# Patient Record
Sex: Female | Born: 1965 | Race: White | Hispanic: No | Marital: Married | Smoking: Never smoker
Health system: Southern US, Community
[De-identification: ages and names within clinical notes are randomized; demographics above are authoritative.]

## PROBLEM LIST (undated history)

## (undated) DIAGNOSIS — L409 Psoriasis, unspecified: Secondary | ICD-10-CM

## (undated) DIAGNOSIS — R519 Headache, unspecified: Secondary | ICD-10-CM

## (undated) DIAGNOSIS — N83202 Unspecified ovarian cyst, left side: Secondary | ICD-10-CM

## (undated) HISTORY — PX: CHOLECYSTECTOMY: SHX55

## (undated) HISTORY — PX: OTHER SURGICAL HISTORY: SHX169

---

## 2005-04-24 ENCOUNTER — Ambulatory Visit: Payer: Self-pay | Admitting: Obstetrics and Gynecology

## 2007-09-08 ENCOUNTER — Ambulatory Visit: Payer: Self-pay | Admitting: Obstetrics and Gynecology

## 2008-09-15 ENCOUNTER — Ambulatory Visit: Payer: Self-pay | Admitting: Obstetrics and Gynecology

## 2009-11-30 ENCOUNTER — Ambulatory Visit: Payer: Self-pay | Admitting: Internal Medicine

## 2010-12-13 ENCOUNTER — Ambulatory Visit: Payer: Self-pay | Admitting: Obstetrics and Gynecology

## 2010-12-15 ENCOUNTER — Ambulatory Visit: Payer: Self-pay | Admitting: Obstetrics and Gynecology

## 2011-07-25 ENCOUNTER — Ambulatory Visit: Payer: Self-pay | Admitting: Obstetrics and Gynecology

## 2012-04-01 ENCOUNTER — Ambulatory Visit: Payer: Self-pay | Admitting: Obstetrics and Gynecology

## 2012-11-18 ENCOUNTER — Ambulatory Visit: Payer: Self-pay | Admitting: Obstetrics and Gynecology

## 2015-12-14 ENCOUNTER — Other Ambulatory Visit: Payer: Self-pay | Admitting: Obstetrics and Gynecology

## 2015-12-14 DIAGNOSIS — Z1231 Encounter for screening mammogram for malignant neoplasm of breast: Secondary | ICD-10-CM

## 2015-12-22 ENCOUNTER — Ambulatory Visit
Admission: RE | Admit: 2015-12-22 | Discharge: 2015-12-22 | Disposition: A | Discharge status: Home / Self Care | Payer: BC Managed Care – PPO | Source: Ambulatory Visit | Attending: Obstetrics and Gynecology | Admitting: Obstetrics and Gynecology

## 2015-12-22 DIAGNOSIS — Z1231 Encounter for screening mammogram for malignant neoplasm of breast: Secondary | ICD-10-CM | POA: Insufficient documentation

## 2017-01-23 ENCOUNTER — Other Ambulatory Visit (HOSPITAL_COMMUNITY): Payer: Self-pay | Admitting: Obstetrics and Gynecology

## 2017-01-23 DIAGNOSIS — Z1231 Encounter for screening mammogram for malignant neoplasm of breast: Secondary | ICD-10-CM

## 2017-02-27 ENCOUNTER — Ambulatory Visit
Admission: RE | Admit: 2017-02-27 | Discharge: 2017-02-27 | Disposition: A | Discharge status: Home / Self Care | Payer: BC Managed Care – PPO | Source: Ambulatory Visit | Attending: Obstetrics and Gynecology | Admitting: Obstetrics and Gynecology

## 2017-02-27 DIAGNOSIS — Z1231 Encounter for screening mammogram for malignant neoplasm of breast: Secondary | ICD-10-CM | POA: Diagnosis not present

## 2018-01-24 ENCOUNTER — Other Ambulatory Visit: Payer: Self-pay | Admitting: Obstetrics and Gynecology

## 2018-01-24 DIAGNOSIS — Z1231 Encounter for screening mammogram for malignant neoplasm of breast: Secondary | ICD-10-CM

## 2018-02-28 ENCOUNTER — Ambulatory Visit
Admission: RE | Admit: 2018-02-28 | Discharge: 2018-02-28 | Disposition: A | Discharge status: Home / Self Care | Payer: BC Managed Care – PPO | Source: Ambulatory Visit | Attending: Obstetrics and Gynecology | Admitting: Obstetrics and Gynecology

## 2018-02-28 DIAGNOSIS — Z1231 Encounter for screening mammogram for malignant neoplasm of breast: Secondary | ICD-10-CM | POA: Diagnosis present

## 2018-03-04 ENCOUNTER — Other Ambulatory Visit: Payer: Self-pay | Admitting: Obstetrics and Gynecology

## 2018-03-04 DIAGNOSIS — R921 Mammographic calcification found on diagnostic imaging of breast: Secondary | ICD-10-CM

## 2018-03-04 DIAGNOSIS — R928 Other abnormal and inconclusive findings on diagnostic imaging of breast: Secondary | ICD-10-CM

## 2018-03-12 ENCOUNTER — Ambulatory Visit
Admission: RE | Admit: 2018-03-12 | Discharge: 2018-03-12 | Disposition: A | Discharge status: Home / Self Care | Payer: BC Managed Care – PPO | Source: Ambulatory Visit | Attending: Obstetrics and Gynecology | Admitting: Obstetrics and Gynecology

## 2018-03-12 DIAGNOSIS — R921 Mammographic calcification found on diagnostic imaging of breast: Secondary | ICD-10-CM | POA: Diagnosis present

## 2018-03-12 DIAGNOSIS — R928 Other abnormal and inconclusive findings on diagnostic imaging of breast: Secondary | ICD-10-CM

## 2019-02-02 ENCOUNTER — Other Ambulatory Visit: Payer: Self-pay | Admitting: Obstetrics and Gynecology

## 2019-02-02 DIAGNOSIS — Z1231 Encounter for screening mammogram for malignant neoplasm of breast: Secondary | ICD-10-CM

## 2019-03-11 ENCOUNTER — Other Ambulatory Visit: Payer: Self-pay

## 2019-03-11 ENCOUNTER — Ambulatory Visit
Admission: RE | Admit: 2019-03-11 | Discharge: 2019-03-11 | Disposition: A | Discharge status: Home / Self Care | Payer: BC Managed Care – PPO | Source: Ambulatory Visit | Attending: Obstetrics and Gynecology | Admitting: Obstetrics and Gynecology

## 2019-03-11 DIAGNOSIS — Z1231 Encounter for screening mammogram for malignant neoplasm of breast: Secondary | ICD-10-CM | POA: Insufficient documentation

## 2020-02-19 ENCOUNTER — Other Ambulatory Visit: Payer: Self-pay | Admitting: Obstetrics and Gynecology

## 2020-02-19 DIAGNOSIS — Z1231 Encounter for screening mammogram for malignant neoplasm of breast: Secondary | ICD-10-CM

## 2020-03-16 ENCOUNTER — Ambulatory Visit
Admission: RE | Admit: 2020-03-16 | Discharge: 2020-03-16 | Disposition: A | Discharge status: Home / Self Care | Payer: BC Managed Care – PPO | Source: Ambulatory Visit | Attending: Obstetrics and Gynecology | Admitting: Obstetrics and Gynecology

## 2020-03-16 DIAGNOSIS — Z1231 Encounter for screening mammogram for malignant neoplasm of breast: Secondary | ICD-10-CM | POA: Diagnosis present

## 2021-02-06 ENCOUNTER — Other Ambulatory Visit: Payer: Self-pay | Admitting: Obstetrics and Gynecology

## 2021-02-06 DIAGNOSIS — Z1231 Encounter for screening mammogram for malignant neoplasm of breast: Secondary | ICD-10-CM

## 2021-03-17 ENCOUNTER — Other Ambulatory Visit: Payer: Self-pay

## 2021-03-17 ENCOUNTER — Ambulatory Visit
Admission: RE | Admit: 2021-03-17 | Discharge: 2021-03-17 | Disposition: A | Discharge status: Home / Self Care | Payer: BC Managed Care – PPO | Source: Ambulatory Visit | Attending: Obstetrics and Gynecology | Admitting: Obstetrics and Gynecology

## 2021-03-17 DIAGNOSIS — Z1231 Encounter for screening mammogram for malignant neoplasm of breast: Secondary | ICD-10-CM | POA: Diagnosis present

## 2021-03-22 ENCOUNTER — Other Ambulatory Visit: Payer: Self-pay | Admitting: Obstetrics and Gynecology

## 2021-03-22 DIAGNOSIS — R928 Other abnormal and inconclusive findings on diagnostic imaging of breast: Secondary | ICD-10-CM

## 2021-03-22 DIAGNOSIS — N6320 Unspecified lump in the left breast, unspecified quadrant: Secondary | ICD-10-CM

## 2021-03-23 ENCOUNTER — Ambulatory Visit
Admission: RE | Admit: 2021-03-23 | Discharge: 2021-03-23 | Disposition: A | Discharge status: Home / Self Care | Payer: BC Managed Care – PPO | Source: Ambulatory Visit | Attending: Obstetrics and Gynecology | Admitting: Obstetrics and Gynecology

## 2021-03-23 ENCOUNTER — Other Ambulatory Visit: Payer: Self-pay

## 2021-03-23 DIAGNOSIS — N6320 Unspecified lump in the left breast, unspecified quadrant: Secondary | ICD-10-CM | POA: Insufficient documentation

## 2021-03-23 DIAGNOSIS — R928 Other abnormal and inconclusive findings on diagnostic imaging of breast: Secondary | ICD-10-CM | POA: Diagnosis present

## 2021-03-30 ENCOUNTER — Encounter: Payer: Self-pay | Admitting: *Deleted

## 2021-03-31 ENCOUNTER — Encounter: Payer: Self-pay | Admitting: *Deleted

## 2021-03-31 ENCOUNTER — Ambulatory Visit: Payer: BC Managed Care – PPO | Admitting: Certified Registered Nurse Anesthetist

## 2021-03-31 ENCOUNTER — Ambulatory Visit
Admission: RE | Admit: 2021-03-31 | Discharge: 2021-03-31 | Disposition: A | Discharge status: Home / Self Care | Payer: BC Managed Care – PPO | Attending: Gastroenterology | Admitting: Gastroenterology

## 2021-03-31 ENCOUNTER — Encounter
Admission: RE | Disposition: A | Discharge status: Home / Self Care | Payer: Self-pay | Source: Home / Self Care | Attending: Gastroenterology

## 2021-03-31 DIAGNOSIS — Z6837 Body mass index (BMI) 37.0-37.9, adult: Secondary | ICD-10-CM | POA: Insufficient documentation

## 2021-03-31 DIAGNOSIS — K640 First degree hemorrhoids: Secondary | ICD-10-CM | POA: Insufficient documentation

## 2021-03-31 DIAGNOSIS — Z885 Allergy status to narcotic agent status: Secondary | ICD-10-CM | POA: Insufficient documentation

## 2021-03-31 DIAGNOSIS — Z9049 Acquired absence of other specified parts of digestive tract: Secondary | ICD-10-CM | POA: Diagnosis not present

## 2021-03-31 DIAGNOSIS — D123 Benign neoplasm of transverse colon: Secondary | ICD-10-CM | POA: Diagnosis not present

## 2021-03-31 DIAGNOSIS — Z793 Long term (current) use of hormonal contraceptives: Secondary | ICD-10-CM | POA: Insufficient documentation

## 2021-03-31 DIAGNOSIS — E669 Obesity, unspecified: Secondary | ICD-10-CM | POA: Insufficient documentation

## 2021-03-31 DIAGNOSIS — K5730 Diverticulosis of large intestine without perforation or abscess without bleeding: Secondary | ICD-10-CM | POA: Insufficient documentation

## 2021-03-31 DIAGNOSIS — Z880 Allergy status to penicillin: Secondary | ICD-10-CM | POA: Diagnosis not present

## 2021-03-31 DIAGNOSIS — Z1211 Encounter for screening for malignant neoplasm of colon: Secondary | ICD-10-CM | POA: Diagnosis not present

## 2021-03-31 DIAGNOSIS — Z79899 Other long term (current) drug therapy: Secondary | ICD-10-CM | POA: Diagnosis not present

## 2021-03-31 DIAGNOSIS — D120 Benign neoplasm of cecum: Secondary | ICD-10-CM | POA: Insufficient documentation

## 2021-03-31 HISTORY — DX: Unspecified ovarian cyst, left side: N83.202

## 2021-03-31 HISTORY — DX: Psoriasis, unspecified: L40.9

## 2021-03-31 HISTORY — PX: COLONOSCOPY WITH PROPOFOL: SHX5780

## 2021-03-31 HISTORY — DX: Headache, unspecified: R51.9

## 2021-03-31 LAB — POCT PREGNANCY, URINE: Preg Test, Ur: NEGATIVE

## 2021-03-31 SURGERY — COLONOSCOPY WITH PROPOFOL
Anesthesia: Monitor Anesthesia Care

## 2021-03-31 MED ORDER — MIDAZOLAM HCL 2 MG/2ML IJ SOLN
INTRAMUSCULAR | Status: DC | PRN
  Administered 2021-03-31: 2 mg via INTRAVENOUS

## 2021-03-31 MED ORDER — PROPOFOL 500 MG/50ML IV EMUL
INTRAVENOUS | Status: AC
  Filled 2021-03-31: qty 50

## 2021-03-31 MED ORDER — PROPOFOL 500 MG/50ML IV EMUL
INTRAVENOUS | Status: DC | PRN
  Administered 2021-03-31: 125 ug/kg/min via INTRAVENOUS

## 2021-03-31 MED ORDER — MIDAZOLAM HCL 2 MG/2ML IJ SOLN
INTRAMUSCULAR | Status: AC
  Filled 2021-03-31: qty 2

## 2021-03-31 MED ORDER — PROPOFOL 10 MG/ML IV BOLUS
INTRAVENOUS | Status: DC | PRN
  Administered 2021-03-31: 30 mg via INTRAVENOUS
  Administered 2021-03-31: 70 mg via INTRAVENOUS

## 2021-03-31 MED ORDER — LIDOCAINE HCL (CARDIAC) PF 100 MG/5ML IV SOSY
PREFILLED_SYRINGE | INTRAVENOUS | Status: DC | PRN
  Administered 2021-03-31: 50 mg via INTRAVENOUS

## 2021-03-31 MED ORDER — SODIUM CHLORIDE 0.9 % IV SOLN
INTRAVENOUS | Status: DC
  Administered 2021-03-31: 1000 mL via INTRAVENOUS

## 2021-03-31 NOTE — Anesthesia Preprocedure Evaluation (Signed)
Anesthesia Evaluation  Patient identified by MRN, date of birth, ID band Patient awake    Reviewed: Allergy & Precautions, NPO status , Patient's Chart, lab work & pertinent test results  Airway Mallampati: II  TM Distance: >3 FB Neck ROM: Full    Dental no notable dental hx.    Pulmonary neg pulmonary ROS,    Pulmonary exam normal breath sounds clear to auscultation- rhonchi (-) wheezing      Cardiovascular Exercise Tolerance: Good negative cardio ROS Normal cardiovascular exam Rhythm:Regular Rate:Normal - Systolic murmurs and - Diastolic murmurs    Neuro/Psych  Headaches,    GI/Hepatic negative GI ROS, Neg liver ROS,   Endo/Other  negative endocrine ROS  Renal/GU negative Renal ROS  negative genitourinary   Musculoskeletal   Abdominal   Peds negative pediatric ROS (+)  Hematology negative hematology ROS (+)   Anesthesia Other Findings   Reproductive/Obstetrics                             Anesthesia Physical Anesthesia Plan  ASA: 2  Anesthesia Plan: MAC   Post-op Pain Management:    Induction: Intravenous  PONV Risk Score and Plan: 1 and Propofol infusion and TIVA  Airway Management Planned: Mask and Natural Airway  Additional Equipment:   Intra-op Plan:   Post-operative Plan:   Informed Consent: I have reviewed the patients History and Physical, chart, labs and discussed the procedure including the risks, benefits and alternatives for the proposed anesthesia with the patient or authorized representative who has indicated his/her understanding and acceptance.     Dental advisory given  Plan Discussed with: Anesthesiologist and CRNA  Anesthesia Plan Comments: (IVGA, PIV x 1, ASA SM, RBO discussed, Consent signed.)        Anesthesia Quick Evaluation

## 2021-03-31 NOTE — Op Note (Signed)
[facility] Patient Name: Brandy Rush Procedure Date: 03/31/2021 12:57 PM MRN: [id number] Account #: [id number] Date of Birth: 12-Sep-1965 Admit Type: Outpatient Age: 55 Room: [facility] ENDO ROOM 3 Gender: Female Note Status: Finalized Procedure:             Colonoscopy Indications:           Screening for colorectal malignant neoplasm Providers:             Andrey Farmer MD, MD Referring MD:          Rusty Aus, MD (Referring MD) Medicines:             Monitored Anesthesia Care Complications:         No immediate complications. Estimated blood loss:                         Minimal. Procedure:             Pre-Anesthesia Assessment:                        - Prior to the procedure, a History and Physical was                         performed, and patient medications and allergies were                         reviewed. The patient is competent. The risks and                         benefits of the procedure and the sedation options and                         risks were discussed with the patient. All questions                         were answered and informed consent was obtained.                         Patient identification and proposed procedure were                         verified by the physician, the nurse, the anesthetist                         and the technician in the endoscopy suite. Mental                         Status Examination: alert and oriented. Airway                         Examination: normal oropharyngeal airway and neck                         mobility. Respiratory Examination: clear to                         auscultation. CV Examination: normal. Prophylactic  Antibiotics: The patient does not require prophylactic                         antibiotics. Prior Anticoagulants: The patient has                         taken no previous anticoagulant or antiplatelet                         agents. ASA  Grade Assessment: II - A patient with mild                         systemic disease. After reviewing the risks and                         benefits, the patient was deemed in satisfactory                         condition to undergo the procedure. The anesthesia                         plan was to use monitored anesthesia care (MAC).                         Immediately prior to administration of medications,                         the patient was re-assessed for adequacy to receive                         sedatives. The heart rate, respiratory rate, oxygen                         saturations, blood pressure, adequacy of pulmonary                         ventilation, and response to care were monitored                         throughout the procedure. The physical status of the                         patient was re-assessed after the procedure.                        After obtaining informed consent, the colonoscope was                         passed under direct vision. Throughout the procedure,                         the patient's blood pressure, pulse, and oxygen                         saturations were monitored continuously. The                         Colonoscope was introduced through the anus and  advanced to the the cecum, identified by appendiceal                         orifice and ileocecal valve. The colonoscopy was                         performed without difficulty. The patient tolerated                         the procedure well. The quality of the bowel                         preparation was good. Findings:      The perianal and digital rectal examinations were normal.      A 4 mm polyp was found in the cecum. The polyp was sessile. The polyp       was removed with a cold snare. Resection and retrieval were complete.       Estimated blood loss was minimal.      Two sessile polyps were found in the hepatic flexure. The polyps were 2       to 3 mm  in size. These polyps were removed with a cold snare. Resection       and retrieval were complete. Estimated blood loss was minimal.      A 1 mm polyp was found in the distal transverse colon. The polyp was       sessile. The polyp was removed with a jumbo cold forceps. Resection and       retrieval were complete. Estimated blood loss was minimal.      A few small-mouthed diverticula were found in the sigmoid colon.      Internal hemorrhoids were found during retroflexion. The hemorrhoids       were Grade I (internal hemorrhoids that do not prolapse).      The exam was otherwise without abnormality on direct and retroflexion       views. Impression:            - One 4 mm polyp in the cecum, removed with a cold                         snare. Resected and retrieved.                        - Two 2 to 3 mm polyps at the hepatic flexure, removed                         with a cold snare. Resected and retrieved.                        - One 1 mm polyp in the distal transverse colon,                         removed with a jumbo cold forceps. Resected and                         retrieved.                        - Diverticulosis in the sigmoid colon.                        -  Internal hemorrhoids.                        - The examination was otherwise normal on direct and                         retroflexion views. Recommendation:        - Discharge patient to home.                        - Resume previous diet.                        - Continue present medications.                        - Await pathology results.                        - Repeat colonoscopy in 3 years for surveillance.                        - Return to referring physician as previously                         scheduled. Procedure Code(s):     --- Professional ---                        [phone], Colonoscopy, flexible; with removal of                         tumor(s), polyp(s), or other lesion(s) by snare                          technique                        45380, 65, Colonoscopy, flexible; with biopsy, single                         or multiple Diagnosis Code(s):     --- Professional ---                        K63.5, Polyp of colon                        Z12.11, Encounter for screening for malignant neoplasm                         of colon                        K64.0, First degree hemorrhoids                        K57.30, Diverticulosis of large intestine without                         perforation or abscess without bleeding CPT copyright 2019 [employer] Association. All rights reserved. The codes documented in this report are preliminary and upon coder review may  be revised to meet current compliance requirements. Andrey Farmer  MD, MD 03/31/2021 1:26:54 PM Number of Addenda: 0 Note Initiated On: 03/31/2021 12:57 PM Scope Withdrawal Time: 0 hours 15 minutes 10 seconds  Total Procedure Duration: 0 hours 20 minutes 4 seconds  Estimated Blood Loss:  Estimated blood loss: none.      [facility]

## 2021-03-31 NOTE — Interval H&P Note (Signed)
History and Physical Interval Note:  03/31/2021 12:46 PM  Brandy Rush  has presented today for surgery, with the diagnosis of Screening.  The various methods of treatment have been discussed with the patient and family. After consideration of risks, benefits and other options for treatment, the patient has consented to  Procedure(s): COLONOSCOPY WITH PROPOFOL (N/A) as a surgical intervention.  The patient's history has been reviewed, patient examined, no change in status, stable for surgery.  I have reviewed the patient's chart and labs.  Questions were answered to the patient's satisfaction.     Lesly Rubenstein  Ok to proceed with colonoscopy

## 2021-03-31 NOTE — Transfer of Care (Signed)
Immediate Anesthesia Transfer of Care Note  Patient: Brandy Rush  Procedure(s) Performed: COLONOSCOPY WITH PROPOFOL  Patient Location: PACU  Anesthesia Type:General  Level of Consciousness: awake  Airway & Oxygen Therapy: Patient Spontanous Breathing and Patient connected to face mask oxygen  Post-op Assessment: Report given to RN and Post -op Vital signs reviewed and stable  Post vital signs: Reviewed and stable  Last Vitals:  Vitals Value Taken Time  BP 117/73 03/31/21 1327  Temp    Pulse 88 03/31/21 1328  Resp 21 03/31/21 1328  SpO2 99 % 03/31/21 1328  Vitals shown include unvalidated device data.  Last Pain:  Vitals:   03/31/21 1327  TempSrc:   PainSc: 0-No pain      Patients Stated Pain Goal: 0 ([phone] [id number])  Complications: No notable events documented.

## 2021-03-31 NOTE — Anesthesia Postprocedure Evaluation (Signed)
Anesthesia Post Note  Patient: Brandy Rush  Procedure(s) Performed: COLONOSCOPY WITH PROPOFOL  Patient location during evaluation: PACU Anesthesia Type: MAC Level of consciousness: awake and alert Pain management: pain level controlled Vital Signs Assessment: post-procedure vital signs reviewed and stable Respiratory status: spontaneous breathing Cardiovascular status: blood pressure returned to baseline Postop Assessment: no headache Anesthetic complications: no   No notable events documented.   Last Vitals:  Vitals:   03/31/21 1337 03/31/21 1347  BP: 135/86 130/78  Pulse: 97 86  Resp: 18 20  Temp:    SpO2: 100% 99%    Last Pain:  Vitals:   03/31/21 1347  TempSrc:   PainSc: 0-No pain                 Milinda Pointer

## 2021-03-31 NOTE — H&P (Signed)
Outpatient short stay form Pre-procedure 03/31/2021 12:43 PM Brandy Miyamoto MD, MPH  Primary Physician: Dr. Sabra Heck  Reason for visit:  Screening Colonoscopy  History of present illness:   55 y/o lady with history of obesity and depression here for screening colonoscopy. History of cholecystectomy and two c-sections. No blood thinners. No family history of GI malignancies.    Current Facility-Administered Medications:    0.9 %  sodium chloride infusion, , Intravenous, Continuous, Natori Gudino, Hilton Cork, MD  Medications Prior to Admission  Medication Sig Dispense Refill Last Dose   etodolac (LODINE) 400 MG tablet Take 400 mg by mouth 2 (two) times daily.      levonorgestrel (LILETTA, 52 MG,) 20.1 MCG/DAY IUD 1 each by Intrauterine route once.      sertraline (ZOLOFT) 50 MG tablet Take 50 mg by mouth daily.   03/31/2021 at 0500am     Allergies  Allergen Reactions   Codeine Sulfate [Codeine]    Penicillins      Past Medical History:  Diagnosis Date   Cyst of left ovary    Headache    Psoriasis     Review of systems:  Otherwise negative.    Physical Exam  Gen: Alert, oriented. Appears stated age.  HEENT: PERRLA. Lungs: No respiratory distress CV: RRR Abd: soft, benign, no masses Ext: No edema    Planned procedures: Proceed with colonoscopy. The patient understands the nature of the planned procedure, indications, risks, alternatives and potential complications including but not limited to bleeding, infection, perforation, damage to internal organs and possible oversedation/side effects from anesthesia. The patient agrees and gives consent to proceed.  Please refer to procedure notes for findings, recommendations and patient disposition/instructions.     Brandy Miyamoto MD, MPH Gastroenterology 03/31/2021  12:43 PM

## 2021-04-03 LAB — SURGICAL PATHOLOGY

## 2021-09-08 ENCOUNTER — Other Ambulatory Visit: Payer: Self-pay

## 2021-09-08 ENCOUNTER — Emergency Department
Admission: EM | Admit: 2021-09-08 | Discharge: 2021-09-08 | Disposition: A | Discharge status: Home / Self Care | Payer: BC Managed Care – PPO | Attending: Emergency Medicine | Admitting: Emergency Medicine

## 2021-09-08 ENCOUNTER — Encounter: Payer: Self-pay | Admitting: Emergency Medicine

## 2021-09-08 ENCOUNTER — Emergency Department: Payer: BC Managed Care – PPO

## 2021-09-08 DIAGNOSIS — J449 Chronic obstructive pulmonary disease, unspecified: Secondary | ICD-10-CM | POA: Diagnosis not present

## 2021-09-08 DIAGNOSIS — Z20822 Contact with and (suspected) exposure to covid-19: Secondary | ICD-10-CM | POA: Diagnosis not present

## 2021-09-08 DIAGNOSIS — I480 Paroxysmal atrial fibrillation: Secondary | ICD-10-CM | POA: Diagnosis not present

## 2021-09-08 DIAGNOSIS — R000 Tachycardia, unspecified: Secondary | ICD-10-CM | POA: Diagnosis present

## 2021-09-08 DIAGNOSIS — R0602 Shortness of breath: Secondary | ICD-10-CM | POA: Diagnosis not present

## 2021-09-08 LAB — COMPREHENSIVE METABOLIC PANEL
ALT: 27 U/L (ref 0–44)
AST: 26 U/L (ref 15–41)
Albumin: 4.1 g/dL (ref 3.5–5.0)
Alkaline Phosphatase: 93 U/L (ref 38–126)
Anion gap: 7 (ref 5–15)
BUN: 13 mg/dL (ref 6–20)
CO2: 23 mmol/L (ref 22–32)
Calcium: 8.6 mg/dL — ABNORMAL LOW (ref 8.9–10.3)
Chloride: 106 mmol/L (ref 98–111)
Creatinine, Ser: 0.66 mg/dL (ref 0.44–1.00)
GFR, Estimated: >60 mL/min (ref >60)
Glucose, Bld: 115 mg/dL — ABNORMAL HIGH (ref 70–99)
Potassium: 3.5 mmol/L (ref 3.5–5.1)
Sodium: 136 mmol/L (ref 135–145)
Total Bilirubin: 0.9 mg/dL (ref 0.3–1.2)
Total Protein: 7 g/dL (ref 6.5–8.1)

## 2021-09-08 LAB — CBC WITH DIFFERENTIAL/PLATELET
Abs Immature Granulocytes: 0.06 10*3/uL (ref 0.00–0.07)
Basophils Absolute: 0 10*3/uL (ref 0.0–0.1)
Basophils Relative: 0 %
Eosinophils Absolute: 0.1 10*3/uL (ref 0.0–0.5)
Eosinophils Relative: 1 %
HCT: 38.7 % (ref 36.0–46.0)
Hemoglobin: 13 g/dL (ref 12.0–15.0)
Immature Granulocytes: 1 %
Lymphocytes Relative: 23 %
Lymphs Abs: 2.3 10*3/uL (ref 0.7–4.0)
MCH: 29.3 pg (ref 26.0–34.0)
MCHC: 33.6 g/dL (ref 30.0–36.0)
MCV: 87.4 fL (ref 80.0–100.0)
Monocytes Absolute: 0.6 10*3/uL (ref 0.1–1.0)
Monocytes Relative: 6 %
Neutro Abs: 6.9 10*3/uL (ref 1.7–7.7)
Neutrophils Relative %: 69 %
Platelets: 241 10*3/uL (ref 150–400)
RBC: 4.43 MIL/uL (ref 3.87–5.11)
RDW: 13.2 % (ref 11.5–15.5)
WBC: 10.1 10*3/uL (ref 4.0–10.5)
nRBC: 0 % (ref 0.0–0.2)

## 2021-09-08 LAB — RESP PANEL BY RT-PCR (FLU A&B, COVID) ARPGX2
Influenza A by PCR: NEGATIVE
Influenza B by PCR: NEGATIVE
SARS Coronavirus 2 by RT PCR: NEGATIVE

## 2021-09-08 LAB — MAGNESIUM: Magnesium: 2 mg/dL (ref 1.7–2.4)

## 2021-09-08 LAB — BRAIN NATRIURETIC PEPTIDE: B Natriuretic Peptide: 18.8 pg/mL (ref 0.0–100.0)

## 2021-09-08 LAB — TROPONIN I (HIGH SENSITIVITY)
Troponin I (High Sensitivity): 24 ng/L — ABNORMAL HIGH (ref <18)
Troponin I (High Sensitivity): 29 ng/L — ABNORMAL HIGH (ref <18)

## 2021-09-08 LAB — D-DIMER, QUANTITATIVE: D-Dimer, Quant: 1.11 ug/mL-FEU — ABNORMAL HIGH (ref 0.00–0.50)

## 2021-09-08 IMAGING — CT CT ANGIO CHEST
2 of 7 series · 19 of 46 positions shown · IV contrast (APPLIED)
Comparison: None.

CLINICAL DATA: Palpitations and shortness of breath. Elevated
D-dimer.

EXAM:
CT ANGIOGRAPHY CHEST WITH CONTRAST
TECHNIQUE: Multidetector CT imaging of the chest was performed using the
standard protocol during bolus administration of intravenous
contrast. Multiplanar CT image reconstructions and MIPs were
obtained to evaluate the vascular anatomy.
CONTRAST:  75mL OMNIPAQUE IOHEXOL 350 MG/ML SOLN

[Series 6: thins · axial · 0.71mm/px · z∈[-563,-295]mm · 16 of 373 slices shown]
[im 19/373  lung]
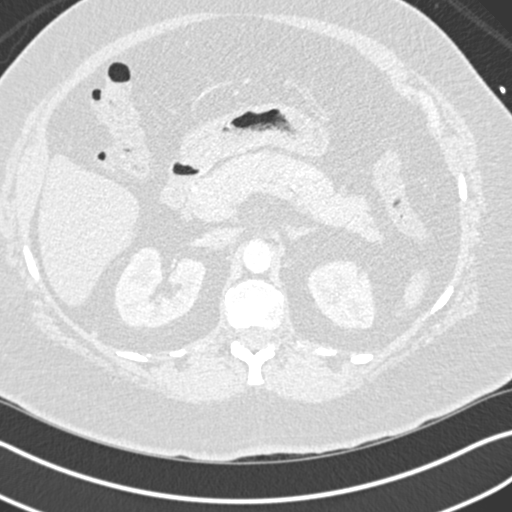
[im 38/373  soft-tissue]
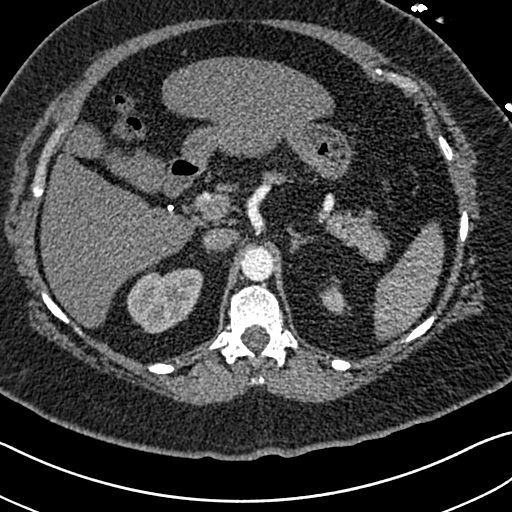
[im 56/373  lung]
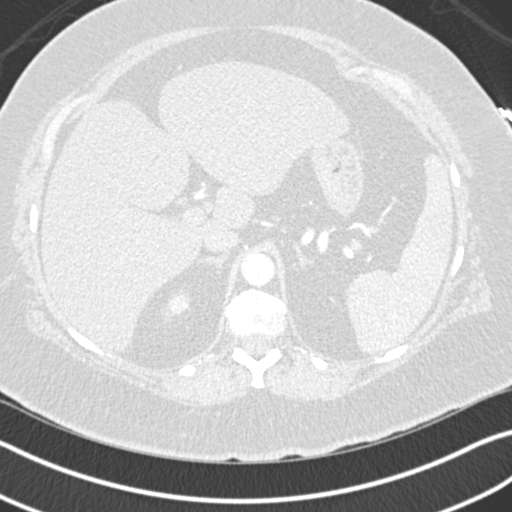
[im 94/373  soft-tissue]
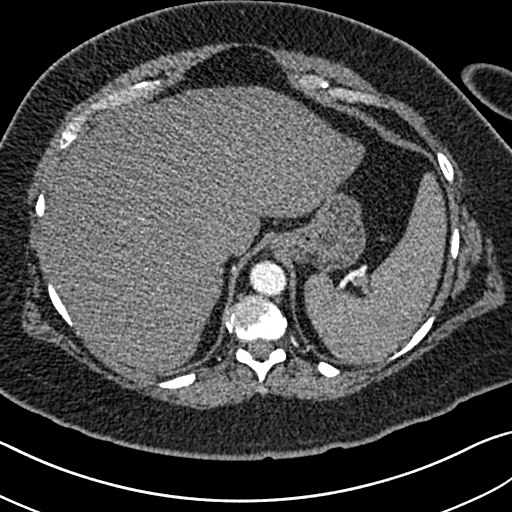
[im 112/373  lung]
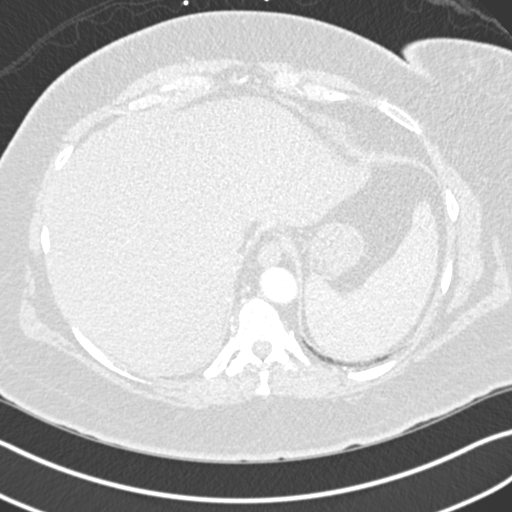
[im 131/373  soft-tissue]
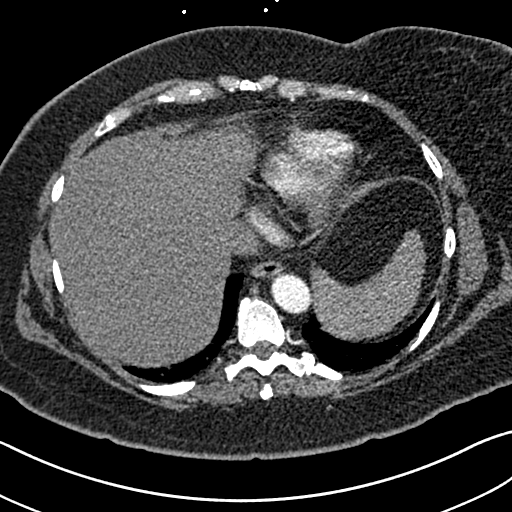
[im 149/373  lung]
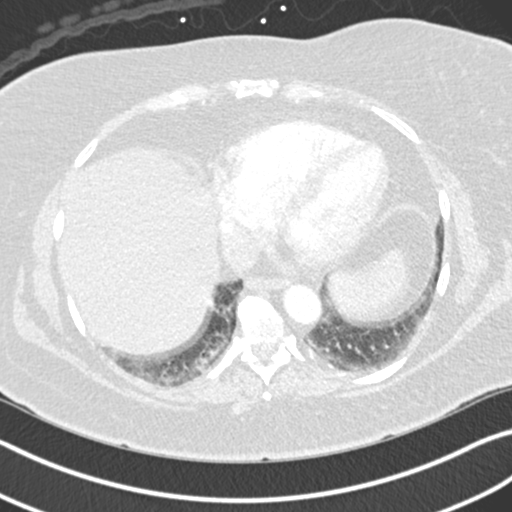
[im 168/373  soft-tissue]
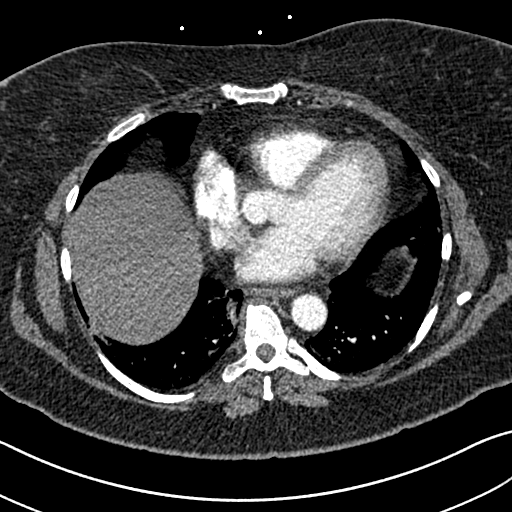
[im 205/373  lung]
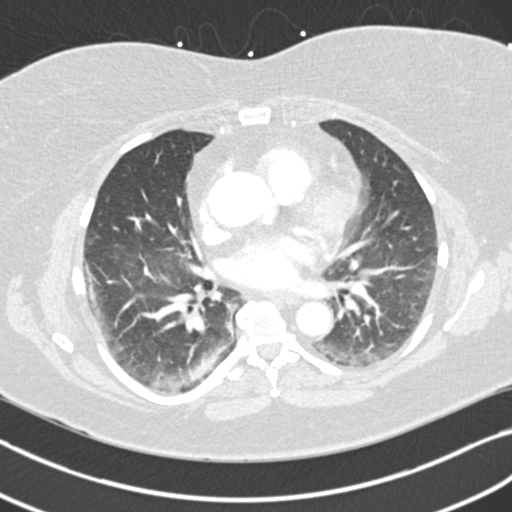
[im 224/373  soft-tissue]
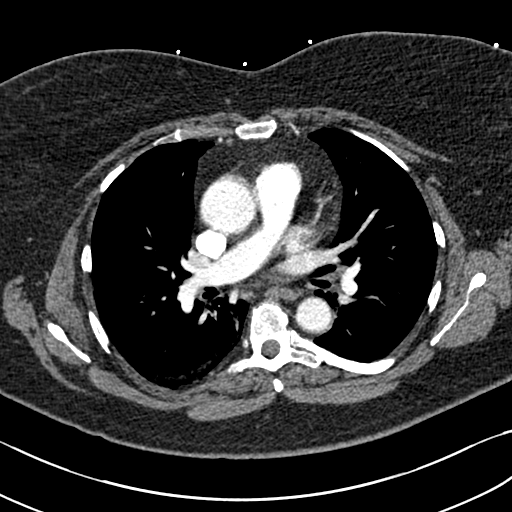
[im 242/373  lung]
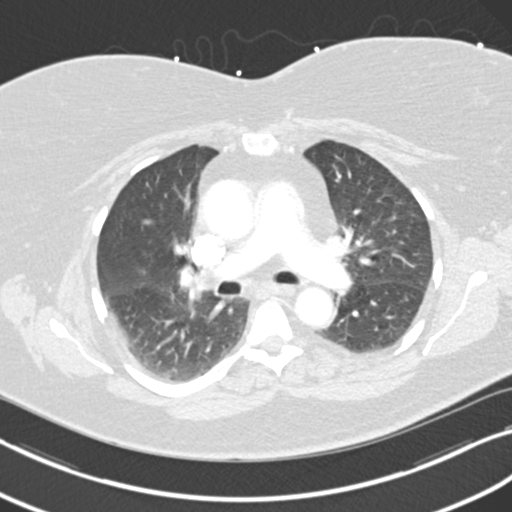
[im 261/373  soft-tissue]
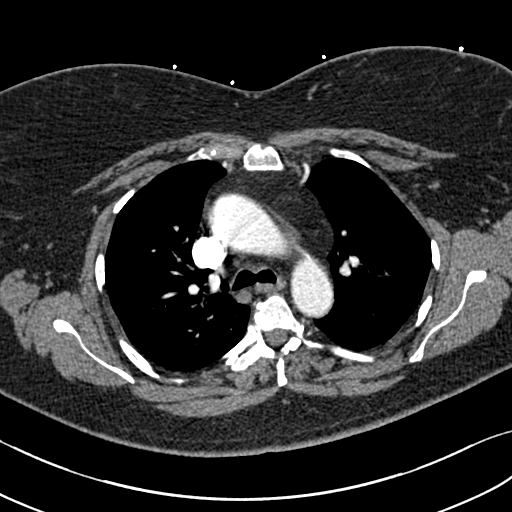
[im 280/373  lung]
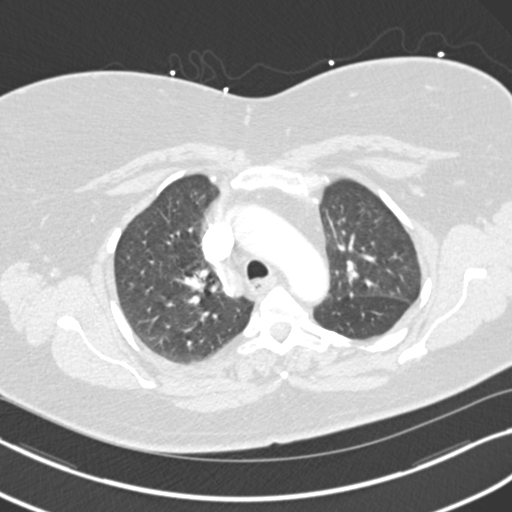
[im 317/373  soft-tissue]
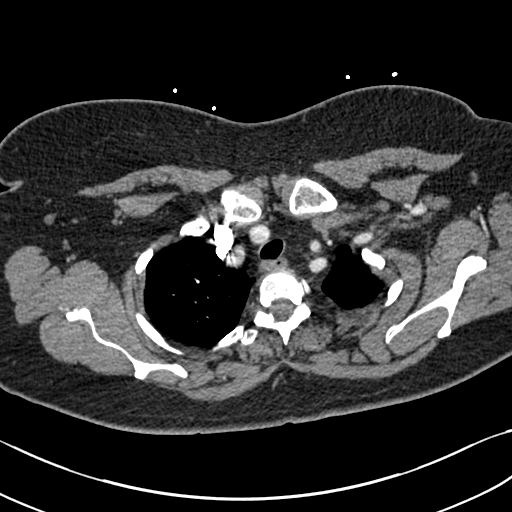
[im 335/373  lung]
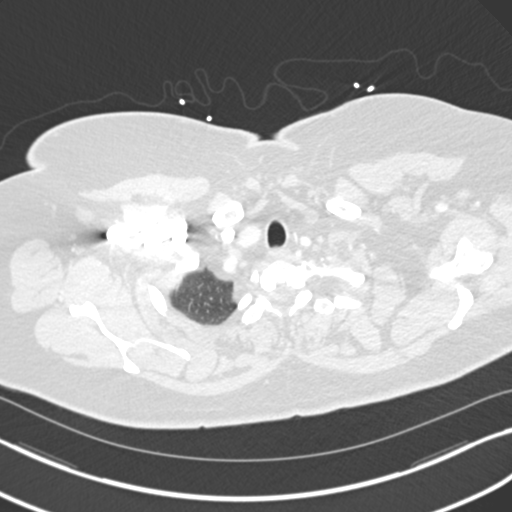
[im 354/373  soft-tissue]
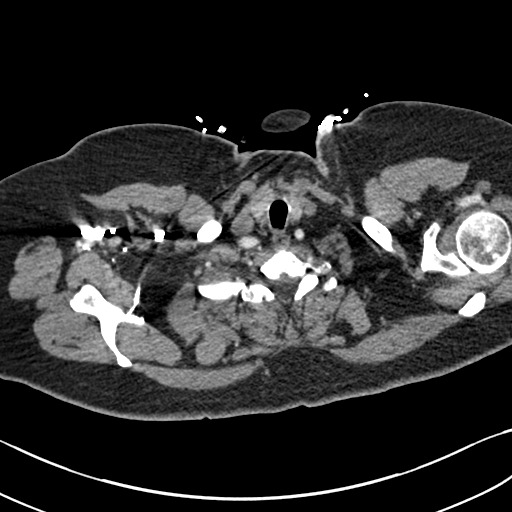

[Series 8: coronal mpr · coronal · 0.61mm/px · 3 of 112 slices shown]
[im 28/112  soft-tissue]
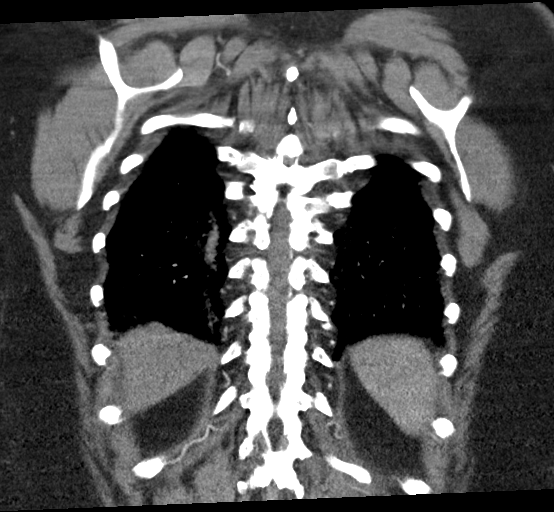
[im 56/112  soft-tissue]
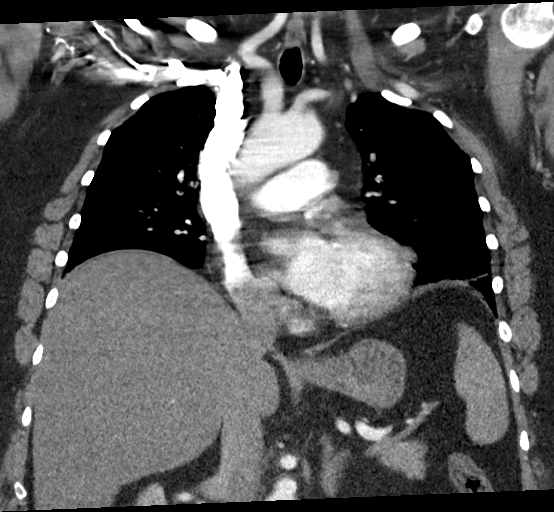
[im 84/112  soft-tissue]
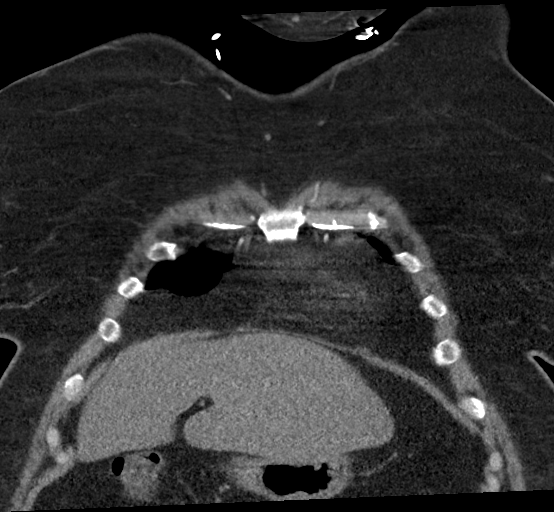

[19 of 46 positions shown; findings below may reference images not displayed]

FINDINGS: Cardiovascular: Satisfactory opacification of the pulmonary arteries
to the segmental level. No evidence of pulmonary embolism. Normal
heart size. No pericardial effusion. No thoracic aortic aneurysm or
dissection.

Mediastinum/Nodes: No enlarged mediastinal, hilar, or axillary lymph
nodes. Thyroid gland, trachea, and esophagus demonstrate no
significant findings.

Lungs/Pleura: Dependent subsegmental atelectasis in both posterior
lower lobes. No focal consolidation, pleural effusion, or
pneumothorax.

Upper Abdomen: No acute abnormality. Mild diffusely decreased liver
density. Mild splenomegaly. Prior cholecystectomy.

Musculoskeletal: No chest wall abnormality. No acute or significant
osseous findings.

Review of the MIP images confirms the above findings.
IMPRESSION: 1. No evidence of pulmonary embolism. No acute intrathoracic
process.
2. Hepatic steatosis.
3. Mild splenomegaly.

## 2021-09-08 MED ORDER — SODIUM CHLORIDE 0.9 % IV BOLUS
1000.0000 mL | Freq: Once | INTRAVENOUS | Status: AC
  Administered 2021-09-08: 1000 mL via INTRAVENOUS

## 2021-09-08 MED ORDER — DILTIAZEM HCL 25 MG/5ML IV SOLN
15.0000 mg | Freq: Once | INTRAVENOUS | Status: DC
  Filled 2021-09-08: qty 5

## 2021-09-08 MED ORDER — IOHEXOL 350 MG/ML SOLN
75.0000 mL | Freq: Once | INTRAVENOUS | Status: AC | PRN
  Administered 2021-09-08: 75 mL via INTRAVENOUS

## 2021-09-08 NOTE — ED Triage Notes (Signed)
Pt to ED via [facility] from home for palpitations. Per EMS pt does not have cardiac history. Pt does have family hx/o A Fib. Per EMS pts HR stayed around 160 with them. Pt states that she has some shortness of breath. Pt is in NAD at this time.

## 2021-09-09 NOTE — ED Provider Notes (Signed)
[facility] Provider Note    Event Date/Time   First MD Initiated Contact with Patient 09/08/21 1108     (approximate)   History   Tachycardia   HPI Brandy Rush is a 56 y.o. female with a new diagnosis of COPD and recently hospitalized for COPD exacerbation who presents for palpitations and tachycardia.  Patient states that this palpitations worse when she is getting up from a seated position and slightly improves when she sits back down or rests.  Patient states these palpitations are also worried with any albuterol inhaler.  Patient states she has been taking her albuterol inhaler but also endorses dyspnea on exertion.  Patient states that she was not discharged from the hospital on any steroid medication     Physical Exam   Triage Vital Signs: ED Triage Vitals  Enc Vitals Group     BP 09/08/21 1109 (!) 142/98     Pulse Rate 09/08/21 1109 (!) 158     Resp 09/08/21 1109 20     Temp 09/08/21 1109 97.8 F (36.6 C)     Temp Source 09/08/21 1109 Oral     SpO2 09/08/21 1109 97 %     Weight 09/08/21 1108 250 lb (113.4 kg)     Height 09/08/21 1108 5\' 7"  (1.702 m)     Head Circumference --      Peak Flow --      Pain Score 09/08/21 1108 0     Pain Loc --      Pain Edu? --      Excl. in [place]? --     Most recent vital signs: Vitals:   09/08/21 1530 09/08/21 1600  BP: (!) 107/40 (!) 119/56  Pulse: 89 89  Resp: 14 16  Temp:    SpO2: 96% 97%    General: Awake, no distress.  CV:  Good peripheral perfusion.  Resp:  Normal effort.  Inspiratory and expiratory wheezing over bilateral lung fields Abd:  No distention.  Other:  Obese middle-aged Caucasian female on stretcher in no distress   ED Results / Procedures / Treatments   Labs (all labs ordered are listed, but only abnormal results are displayed) Labs Reviewed  COMPREHENSIVE METABOLIC PANEL - Abnormal; Notable for the following components:      Result Value   Glucose, Bld 115 (*)     Calcium 8.6 (*)    All other components within normal limits  D-DIMER, QUANTITATIVE - Abnormal; Notable for the following components:   D-Dimer, Quant 1.11 (*)    All other components within normal limits  TROPONIN I (HIGH SENSITIVITY) - Abnormal; Notable for the following components:   Troponin I (High Sensitivity) 24 (*)    All other components within normal limits  TROPONIN I (HIGH SENSITIVITY) - Abnormal; Notable for the following components:   Troponin I (High Sensitivity) 29 (*)    All other components within normal limits  RESP PANEL BY RT-PCR (FLU A&B, COVID) ARPGX2  BRAIN NATRIURETIC PEPTIDE  CBC WITH DIFFERENTIAL/PLATELET  MAGNESIUM     EKG ED ECG REPORT I, Naaman Plummer, the attending physician, personally viewed and interpreted this ECG.  Date: 09/09/2021 EKG Time: 1108 Rate: 159 Rhythm: Tachycardic sinus rhythm QRS Axis: normal Intervals: normal ST/T Wave abnormalities: normal Narrative Interpretation: Tachycardic sinus rhythm no evidence of acute ischemia   RADIOLOGY ED MD interpretation: CT angiography of the chest shows no evidence of acute pulmonary emboli or any other acute intrathoracic process.  Agree  with radiology interpretation  Official radiology report(s): No results found.    PROCEDURES:  Critical Care performed: No  Procedures   MEDICATIONS ORDERED IN ED: Medications  sodium chloride 0.9 % bolus 1,000 mL (0 mLs Intravenous Stopped 09/08/21 1423)  iohexol (OMNIPAQUE) 350 MG/ML injection 75 mL (75 mLs Intravenous Contrast Given 09/08/21 1432)     IMPRESSION / MDM / ASSESSMENT AND PLAN / ED COURSE  I reviewed the triage vital signs and the nursing notes.                              The patient is on the cardiac monitor to evaluate for evidence of arrhythmia and/or significant heart rate changes.  The patient appears to be suffering from a moderate exacerbation of COPD.  Based on the history, exam, CXR/EKG, and further workup I dont  suspect any other emergent cause of this presentation, such as pneumonia, acute coronary syndrome, congestive heart failure, pulmonary embolism, or pneumothorax.  ED Interventions: bronchodilators, steroids, antibiotics, reassess  Reassessment: After treatment, the patients shortness of breath is resolved, and their lung exam has returned to baseline. They are comfortable and want to go home.  Rx: Steroids, Antibiotics, Albuterol Disposition: Discharge home with SRP. PCP follow up recommended in next 48hours.       FINAL CLINICAL IMPRESSION(S) / ED DIAGNOSES   Final diagnoses:  Paroxysmal atrial fibrillation ([place])     Rx / DC Orders   ED Discharge Orders     None        Note:  This document was prepared using Dragon voice recognition software and may include unintentional dictation errors.   Naaman Plummer, MD 09/09/21 [phone]

## 2021-09-10 ENCOUNTER — Encounter: Payer: Self-pay | Admitting: Emergency Medicine

## 2021-09-10 ENCOUNTER — Emergency Department
Admission: EM | Admit: 2021-09-10 | Discharge: 2021-09-10 | Disposition: A | Discharge status: Home / Self Care | Payer: BC Managed Care – PPO | Attending: Emergency Medicine | Admitting: Emergency Medicine

## 2021-09-10 ENCOUNTER — Other Ambulatory Visit: Payer: Self-pay

## 2021-09-10 DIAGNOSIS — I471 Supraventricular tachycardia: Secondary | ICD-10-CM

## 2021-09-10 DIAGNOSIS — R0602 Shortness of breath: Secondary | ICD-10-CM | POA: Diagnosis present

## 2021-09-10 LAB — TSH: TSH: 1.103 u[IU]/mL (ref 0.350–4.500)

## 2021-09-10 LAB — CBC
HCT: 40.6 % (ref 36.0–46.0)
Hemoglobin: 13.8 g/dL (ref 12.0–15.0)
MCH: 29.6 pg (ref 26.0–34.0)
MCHC: 34 g/dL (ref 30.0–36.0)
MCV: 87.1 fL (ref 80.0–100.0)
Platelets: 266 10*3/uL (ref 150–400)
RBC: 4.66 MIL/uL (ref 3.87–5.11)
RDW: 13.2 % (ref 11.5–15.5)
WBC: 8.2 10*3/uL (ref 4.0–10.5)
nRBC: 0 % (ref 0.0–0.2)

## 2021-09-10 LAB — COMPREHENSIVE METABOLIC PANEL
ALT: 32 U/L (ref 0–44)
AST: 38 U/L (ref 15–41)
Albumin: 4.1 g/dL (ref 3.5–5.0)
Alkaline Phosphatase: 105 U/L (ref 38–126)
Anion gap: 10 (ref 5–15)
BUN: 15 mg/dL (ref 6–20)
CO2: 22 mmol/L (ref 22–32)
Calcium: 9 mg/dL (ref 8.9–10.3)
Chloride: 106 mmol/L (ref 98–111)
Creatinine, Ser: 0.96 mg/dL (ref 0.44–1.00)
GFR, Estimated: >60 mL/min (ref >60)
Glucose, Bld: 147 mg/dL — ABNORMAL HIGH (ref 70–99)
Potassium: 3.8 mmol/L (ref 3.5–5.1)
Sodium: 138 mmol/L (ref 135–145)
Total Bilirubin: 1 mg/dL (ref 0.3–1.2)
Total Protein: 7.3 g/dL (ref 6.5–8.1)

## 2021-09-10 LAB — TROPONIN I (HIGH SENSITIVITY): Troponin I (High Sensitivity): 11 ng/L (ref <18)

## 2021-09-10 MED ORDER — METOPROLOL TARTRATE 25 MG PO TABS
25.0000 mg | ORAL_TABLET | Freq: Once | ORAL | Status: AC
  Administered 2021-09-10: 25 mg via ORAL
  Filled 2021-09-10: qty 1

## 2021-09-10 MED ORDER — METOPROLOL TARTRATE 25 MG PO TABS
25.0000 mg | ORAL_TABLET | Freq: Two times a day (BID) | ORAL | 11 refills | Status: AC
Start: 2021-09-10 — End: 2024-05-18

## 2021-09-10 MED ORDER — ADENOSINE 6 MG/2ML IV SOLN
6.0000 mg | Freq: Once | INTRAVENOUS | Status: DC
  Filled 2021-09-10: qty 2

## 2021-09-10 NOTE — ED Triage Notes (Signed)
Pt via POV from home. Pt c/o states that she had a new onset of a fib a couple of days ago, that it self corrected and now she is feel palpitations and dizziness again. Denies pain. Pt is A&Ox5 and NAD.

## 2021-09-10 NOTE — ED Notes (Signed)
This RN called & spoke with lab re: labs not yet received, but blood work was sent a while ago, before orders were entered. Labs will be received now.

## 2021-09-10 NOTE — Discharge Instructions (Signed)
Please follow up with cardiology

## 2021-09-10 NOTE — ED Provider Notes (Signed)
[facility] Provider Note    Event Date/Time   First MD Initiated Contact with Patient 09/10/21 1446     (approximate)   History   Palpitations   HPI  Brandy Rush is a 56 y.o. female seen 2 days ago for mild shortness of breath and found to be in possibly atrial fibrillation.  Converted spontaneously, has been feeling better except develop palpitations approximately 1 hour prior to arrival.  Initial EKG here is more concerning for SVT.  She denies chest pain.  Has not take anything for this     Physical Exam   Triage Vital Signs: ED Triage Vitals  Enc Vitals Group     BP 09/10/21 1435 114/88     Pulse Rate 09/10/21 1434 (!) 173     Resp 09/10/21 1434 20     Temp 09/10/21 1434 98.5 F (36.9 C)     Temp Source 09/10/21 1434 Oral     SpO2 09/10/21 1434 98 %     Weight 09/10/21 1426 113.4 kg (250 lb)     Height 09/10/21 1426 1.702 m (5\' 7" )     Head Circumference --      Peak Flow --      Pain Score 09/10/21 1426 0     Pain Loc --      Pain Edu? --      Excl. in [place]? --     Most recent vital signs: Vitals:   09/10/21 1500 09/10/21 1545  BP: 130/73   Pulse: (!) 164 93  Resp:  18  Temp:    SpO2: 96% 96%     General: Awake, no distress.  CV:  Good peripheral perfusion.  Tachycardia, regular Resp:  Normal effort.  Abd:  No distention.  Other:  No lower extremity swelling   ED Results / Procedures / Treatments   Labs (all labs ordered are listed, but only abnormal results are displayed) Labs Reviewed  COMPREHENSIVE METABOLIC PANEL - Abnormal; Notable for the following components:      Result Value   Glucose, Bld 147 (*)    All other components within normal limits  CBC  TSH  TROPONIN I (HIGH SENSITIVITY)  TROPONIN I (HIGH SENSITIVITY)     EKG  ED ECG REPORT I, Lavonia Drafts, the attending physician, personally viewed and interpreted this ECG.  Date: 09/10/2021  Rhythm: SVT QRS Axis: normal Intervals: normal ST/T  Wave abnormalities: normal Narrative Interpretation: no evidence of acute ischemia    RADIOLOGY Reviewed CT angiography from 2 days ago, unremarkable    PROCEDURES:  Critical Care performed: yes{  .1-3 Lead EKG Interpretation Performed by: Lavonia Drafts, MD Authorized by: Lavonia Drafts, MD     Interpretation: abnormal     ECG rate assessment: tachycardic     Rhythm: SVT     Ectopy: none     Conduction: normal     MEDICATIONS ORDERED IN ED: Medications  adenosine (ADENOCARD) 6 MG/2ML injection 6 mg (has no administration in time range)  metoprolol tartrate (LOPRESSOR) tablet 25 mg (has no administration in time range)     IMPRESSION / MDM / ASSESSMENT AND PLAN / ED COURSE  I reviewed the triage vital signs and the nursing notes.   Differential diagnosis includes, but is not limited to, SVT, atrial fibrillation, sinus tachycardia   Patient presents with return of palpitations, she states that she feels mildly short of breath when these develop.  Her heart rate is noted to be in  the 160s.  Appears to be irregular and more consistent with SVT than atrial fibrillation.  Attempted vagal maneuvers without success.  Lab work is pending  Patient has been placed on the cardiac monitor given arrhythmia  Will order IV adenosine  Lab work reviewed by me and is reassuring, normal troponin, normal CBC, normal BMP ----------------------------------------- 4:28 PM on 09/10/2021 -----------------------------------------  Nurse was preparing to give IV adenosine and patient spontaneously converted into normal sinus rhythm.  She is feeling much improved  We will start the patient on metoprolol p.o. twice daily, emphasized need for cardiology follow-up      FINAL CLINICAL IMPRESSION(S) / ED DIAGNOSES   Final diagnoses:  SVT (supraventricular tachycardia) ([place])     Rx / DC Orders   ED Discharge Orders          Ordered    metoprolol tartrate (LOPRESSOR) 25 MG tablet   2 times daily        09/10/21 1620             Note:  This document was prepared using Dragon voice recognition software and may include unintentional dictation errors.   Lavonia Drafts, MD 09/10/21 [phone]

## 2021-09-10 NOTE — ED Provider Triage Note (Signed)
Emergency Medicine Provider Triage Evaluation Note  Brandy Rush , a 56 y.o. female  was evaluated in triage.  Pt complains of elevated heart rate and shortness of breath.  She reports this started 45 minutes ago.  She tried to bear down in both withdrawal but was unable to get this to resolve.  She was seen Friday for the same, diagnosed with A. fib which resolved within a few hours.  She had planned to follow-up with cardiology but has not had the opportunity to do so yet.  Review of Systems  Positive: Elevated heart rate and shortness of breath Negative: Syncope, dizziness, vision changes  Physical Exam  Ht 5\' 7"  (1.702 m)    Wt 113.4 kg    BMI 39.16 kg/m  Gen:   Awake, no distress   Resp:  Slightly increased effort, CTA bilaterally Cardio:  Tachycardic  Medical Decision Making  Medically screening exam initiated at 2:32 PM.  Appropriate orders placed.  JOYANN SPIDLE was informed that the remainder of the evaluation will be completed by another provider, this initial triage assessment does not replace that evaluation, and the importance of remaining in the ED until their evaluation is complete.  Palpitations, Shortness of Breath\:  ECG-SVT   Jearld Fenton, NP 09/10/21 1433

## 2022-06-27 ENCOUNTER — Other Ambulatory Visit: Payer: Self-pay | Admitting: Obstetrics and Gynecology

## 2022-06-27 DIAGNOSIS — Z1231 Encounter for screening mammogram for malignant neoplasm of breast: Secondary | ICD-10-CM

## 2022-06-28 ENCOUNTER — Ambulatory Visit
Admission: RE | Admit: 2022-06-28 | Discharge: 2022-06-28 | Disposition: A | Discharge status: Home / Self Care | Payer: BC Managed Care – PPO | Source: Ambulatory Visit | Attending: Obstetrics and Gynecology | Admitting: Obstetrics and Gynecology

## 2022-06-28 DIAGNOSIS — Z1231 Encounter for screening mammogram for malignant neoplasm of breast: Secondary | ICD-10-CM | POA: Diagnosis present

## 2023-11-06 ENCOUNTER — Other Ambulatory Visit: Payer: Self-pay | Admitting: Obstetrics and Gynecology

## 2023-11-06 DIAGNOSIS — Z1231 Encounter for screening mammogram for malignant neoplasm of breast: Secondary | ICD-10-CM

## 2023-11-15 ENCOUNTER — Ambulatory Visit
Admission: RE | Admit: 2023-11-15 | Discharge: 2023-11-15 | Disposition: A | Discharge status: Home / Self Care | Payer: Self-pay | Source: Ambulatory Visit | Attending: Obstetrics and Gynecology | Admitting: Obstetrics and Gynecology

## 2023-11-15 DIAGNOSIS — Z1231 Encounter for screening mammogram for malignant neoplasm of breast: Secondary | ICD-10-CM | POA: Insufficient documentation

## 2024-02-05 ENCOUNTER — Emergency Department

## 2024-02-05 ENCOUNTER — Encounter: Payer: Self-pay | Admitting: Emergency Medicine

## 2024-02-05 ENCOUNTER — Emergency Department
Admission: EM | Admit: 2024-02-05 | Discharge: 2024-02-05 | Disposition: A | Discharge status: Home / Self Care | Attending: Emergency Medicine | Admitting: Emergency Medicine

## 2024-02-05 ENCOUNTER — Other Ambulatory Visit: Payer: Self-pay

## 2024-02-05 DIAGNOSIS — S0285XA Fracture of orbit, unspecified, initial encounter for closed fracture: Secondary | ICD-10-CM

## 2024-02-05 DIAGNOSIS — Y92481 Parking lot as the place of occurrence of the external cause: Secondary | ICD-10-CM | POA: Diagnosis not present

## 2024-02-05 DIAGNOSIS — S0083XA Contusion of other part of head, initial encounter: Secondary | ICD-10-CM

## 2024-02-05 DIAGNOSIS — S0592XA Unspecified injury of left eye and orbit, initial encounter: Secondary | ICD-10-CM | POA: Diagnosis present

## 2024-02-05 DIAGNOSIS — S0232XA Fracture of orbital floor, left side, initial encounter for closed fracture: Secondary | ICD-10-CM | POA: Insufficient documentation

## 2024-02-05 DIAGNOSIS — W01198A Fall on same level from slipping, tripping and stumbling with subsequent striking against other object, initial encounter: Secondary | ICD-10-CM | POA: Diagnosis not present

## 2024-02-05 DIAGNOSIS — W19XXXA Unspecified fall, initial encounter: Secondary | ICD-10-CM

## 2024-02-05 LAB — TROPONIN I (HIGH SENSITIVITY): Troponin I (High Sensitivity): 3 ng/L (ref <18)

## 2024-02-05 LAB — CBC
HCT: 36.8 % (ref 36.0–46.0)
Hemoglobin: 12.6 g/dL (ref 12.0–15.0)
MCH: 29.4 pg (ref 26.0–34.0)
MCHC: 34.2 g/dL (ref 30.0–36.0)
MCV: 86 fL (ref 80.0–100.0)
Platelets: 235 10*3/uL (ref 150–400)
RBC: 4.28 MIL/uL (ref 3.87–5.11)
RDW: 13.5 % (ref 11.5–15.5)
WBC: 9.8 10*3/uL (ref 4.0–10.5)
nRBC: 0 % (ref 0.0–0.2)

## 2024-02-05 LAB — BASIC METABOLIC PANEL WITH GFR
Anion gap: 11 (ref 5–15)
BUN: 17 mg/dL (ref 6–20)
CO2: 22 mmol/L (ref 22–32)
Calcium: 8.9 mg/dL (ref 8.9–10.3)
Chloride: 107 mmol/L (ref 98–111)
Creatinine, Ser: 0.73 mg/dL (ref 0.44–1.00)
GFR, Estimated: >60 mL/min (ref >60)
Glucose, Bld: 138 mg/dL — ABNORMAL HIGH (ref 70–99)
Potassium: 3.8 mmol/L (ref 3.5–5.1)
Sodium: 140 mmol/L (ref 135–145)

## 2024-02-05 MED ORDER — FLUORESCEIN SODIUM 1 MG OP STRP
ORAL_STRIP | OPHTHALMIC | Status: AC
  Administered 2024-02-05: 1
  Filled 2024-02-05: qty 2

## 2024-02-05 MED ORDER — OXYCODONE-ACETAMINOPHEN 5-325 MG PO TABS: 1.0000 | ORAL_TABLET | ORAL | 0 refills | Status: AC | PRN

## 2024-02-05 MED ORDER — TETRACAINE HCL 0.5 % OP SOLN
1.0000 [drp] | Freq: Once | OPHTHALMIC | Status: AC
  Administered 2024-02-05: 1 [drp] via OPHTHALMIC
  Filled 2024-02-05: qty 4

## 2024-02-05 MED ORDER — OXYCODONE-ACETAMINOPHEN 5-325 MG PO TABS
1.0000 | ORAL_TABLET | Freq: Once | ORAL | Status: AC
  Administered 2024-02-05: 1 via ORAL
  Filled 2024-02-05: qty 1

## 2024-02-05 NOTE — ED Triage Notes (Signed)
 Patient to ED via [facility] from parking lot. Pt reports walking dogs and they pulled her down causing her to hit her head. Hematoma to left eye and bruising. C/o facial pain. Also states her chest "feels funny" since the fall.   Given 4mg  zofran and 100 mcg fentanyl by EMS.

## 2024-02-05 NOTE — ED Notes (Signed)
 Pt has facial bruising to the left part of her face and nose. Pt also has a small abrasion of the nose. Pt sts that she was pulled down by a 60lb dog.

## 2024-02-05 NOTE — ED Notes (Signed)
Xray  [facility]  with  [facility] 

## 2024-02-05 NOTE — ED Triage Notes (Signed)
 First Nurse Note:  Pt via [facility] from parking lot. Pt had a mechanical fall. Hit her head. Pt has hematoma to the L eye. Pt c/o facial pain Pt is A&Ox4 and NAD 18G R AC  101/50 BP  56 HR 97% on RA  33 ETCO2  EMS gave 4mg  of Zofran and 100mcg of Fentanyl.

## 2024-02-05 NOTE — ED Provider Notes (Signed)
 [facility] Provider Note   Event Date/Time   First MD Initiated Contact with Patient 02/05/24 1149     (approximate) History  Fall  HPI Brandy Rush is a 58 y.o. female with a history of psoriasis who presents via EMS from the parking lot after reportedly being pulled to the ground by her dogs causing her to hit the left side of her face on concrete.  Patient has a significant hematoma over the left eye with surrounding bruising and complaining of facial pain.  Patient endorses loss of consciousness for a brief period as well ROS: Patient currently denies any vision changes, tinnitus, difficulty speaking, facial droop, sore throat, chest pain, shortness of breath, abdominal pain, nausea/vomiting/diarrhea, dysuria, or weakness/numbness/paresthesias in any extremity   Physical Exam  Triage Vital Signs: ED Triage Vitals  Encounter Vitals Group     BP 02/05/24 1145 118/61     Systolic BP Percentile --      Diastolic BP Percentile --      Pulse Rate 02/05/24 1145 (!) 51     Resp 02/05/24 1145 17     Temp 02/05/24 1145 99 F (37.2 C)     Temp Source 02/05/24 1145 Oral     SpO2 02/05/24 1145 94 %     Weight 02/05/24 1146 280 lb (127 kg)     Height 02/05/24 1146 5\' 7"  (1.702 m)     Head Circumference --      Peak Flow --      Pain Score 02/05/24 1146 7     Pain Loc --      Pain Education --      Exclude from Growth Chart --    Most recent vital signs: Vitals:   02/05/24 1145 02/05/24 1319  BP: 118/61   Pulse: (!) 51   Resp: 17   Temp: 99 F (37.2 C) 97.9 F (36.6 C)  SpO2: 94%    General: Awake, oriented x4. CV:  Good peripheral perfusion. Resp:  Normal effort. Abd:  No distention. Other:  Middle-aged obese Caucasian female resting comfortably in no acute distress.  Significant hematoma with left eyelid swelling causing closure of the left eye.  Examination with eyelid raised shows difficulty moving the left globe inferiorly and causing double  vision when asked to do so.  Other extraocular movements are intact.  Fluorescein exams shows no evidence of ulceration, laceration, or Seidel sign ED Results / Procedures / Treatments  Labs (all labs ordered are listed, but only abnormal results are displayed) Labs Reviewed  BASIC METABOLIC PANEL WITH GFR - Abnormal; Notable for the following components:      Result Value   Glucose, Bld 138 (*)    All other components within normal limits  CBC  POC URINE PREG, ED  TROPONIN I (HIGH SENSITIVITY)  TROPONIN I (HIGH SENSITIVITY)   EKG ED ECG REPORT I, Charleen Conn, the attending physician, personally viewed and interpreted this ECG. Date: 02/05/2024 EKG Time: 1146 Rate: 54 Rhythm: normal sinus rhythm QRS Axis: normal Intervals: normal ST/T Wave abnormalities: normal Narrative Interpretation: Multiple PVCs no evidence of acute ischemia RADIOLOGY ED MD interpretation: 2 View chest x-ray interpreted by me shows no evidence of acute abnormalities including no pneumonia, pneumothorax, or widened mediastinum CT of the maxillofacial structures without contrast shows a large depressed orbital blowout fracture on the left with fat herniation.  There is also intraconal soft tissue stranding about the left posterior globe and left optic nerve CT  of the head without contrast interpreted by me shows no evidence of acute abnormalities including no intracerebral hemorrhage, obvious masses, or significant edema CT of the cervical spine interpreted by me does not show any evidence of acute abnormalities including no acute fracture, malalignment, height loss, or dislocation - All radiology independently interpreted and agree with radiology assessment Official radiology report(s): DG Chest 2 View Result Date: 02/05/2024 CLINICAL DATA:  cp EXAM: CHEST - 2 VIEW COMPARISON:  September 08, 2021 FINDINGS: The study is significantly degraded by patient's body habitus. Lower lung volumes. No focal airspace  consolidation, pleural effusion, or pneumothorax. No cardiomegaly. No acute, displaced fracture or destructive lesion. Multilevel thoracic osteophytosis. IMPRESSION: No acute cardiopulmonary abnormality. Electronically Signed   By: Rance Burrows M.D.   On: 02/05/2024 13:03   CT Maxillofacial Wo Contrast Result Date: 02/05/2024 CLINICAL DATA:  Fall, head injury, hematoma to left eye EXAM: CT HEAD WITHOUT CONTRAST CT MAXILLOFACIAL WITHOUT CONTRAST CT CERVICAL SPINE WITHOUT CONTRAST TECHNIQUE: Multidetector CT imaging of the head, cervical spine, and maxillofacial structures were performed using the standard protocol without intravenous contrast. Multiplanar CT image reconstructions of the cervical spine and maxillofacial structures were also generated. RADIATION DOSE REDUCTION: This exam was performed according to the departmental dose-optimization program which includes automated exposure control, adjustment of the mA and/or kV according to patient size and/or use of iterative reconstruction technique. COMPARISON:  None Available. FINDINGS: CT HEAD FINDINGS Brain: No evidence of acute infarction, hemorrhage, hydrocephalus, extra-axial collection or mass lesion/mass effect. Vascular: No hyperdense vessel or unexpected calcification. CT FACIAL BONES FINDINGS Skull: Normal. Negative for fracture or focal lesion. Facial bones: Large, depressed blowout fracture of the left orbital floor fracture fragment measuring 1.7 x 1.7 cm, with 1.0 cm of maximum depression and fat herniation only (series 8, image 28, series 9, image 39). Sinuses/Orbits: Intraconal soft tissue stranding about the posterior left globe and left optic nerve (series 3, image 7). The contour of the left globe is intact. Small hemorrhagic air-fluid level in the left maxillary sinus. Other: Large soft tissue contusion and hematoma of the left forehead and overlying the left orbit. CT CERVICAL SPINE FINDINGS Alignment: Straightening of the normal cervical  lordosis. Skull base and vertebrae: No acute fracture. No primary bone lesion or focal pathologic process. Soft tissues and spinal canal: No prevertebral fluid or swelling. No visible canal hematoma. Disc levels: Focally mild cervical disc degenerative disease at C5-C6. Upper chest: Negative. Other: None. IMPRESSION: 1. No acute intracranial pathology. 2. Large, depressed blowout fracture of the left orbital floor fracture fragment measuring 1.7 x 1.7 cm, with 1.0 cm of maximum depression and fat herniation only. 3. Intraconal soft tissue stranding about the posterior left globe and left optic nerve. The contour of the left globe is intact. 4. Large soft tissue contusion and hematoma of the left forehead and overlying the left orbit. 5. No fracture or static subluxation of the cervical spine. Electronically Signed   By: Fredricka Jenny M.D.   On: 02/05/2024 12:54   CT Head Wo Contrast Result Date: 02/05/2024 CLINICAL DATA:  Fall, head injury, hematoma to left eye EXAM: CT HEAD WITHOUT CONTRAST CT MAXILLOFACIAL WITHOUT CONTRAST CT CERVICAL SPINE WITHOUT CONTRAST TECHNIQUE: Multidetector CT imaging of the head, cervical spine, and maxillofacial structures were performed using the standard protocol without intravenous contrast. Multiplanar CT image reconstructions of the cervical spine and maxillofacial structures were also generated. RADIATION DOSE REDUCTION: This exam was performed according to the departmental dose-optimization program which includes automated  exposure control, adjustment of the mA and/or kV according to patient size and/or use of iterative reconstruction technique. COMPARISON:  None Available. FINDINGS: CT HEAD FINDINGS Brain: No evidence of acute infarction, hemorrhage, hydrocephalus, extra-axial collection or mass lesion/mass effect. Vascular: No hyperdense vessel or unexpected calcification. CT FACIAL BONES FINDINGS Skull: Normal. Negative for fracture or focal lesion. Facial bones: Large,  depressed blowout fracture of the left orbital floor fracture fragment measuring 1.7 x 1.7 cm, with 1.0 cm of maximum depression and fat herniation only (series 8, image 28, series 9, image 39). Sinuses/Orbits: Intraconal soft tissue stranding about the posterior left globe and left optic nerve (series 3, image 7). The contour of the left globe is intact. Small hemorrhagic air-fluid level in the left maxillary sinus. Other: Large soft tissue contusion and hematoma of the left forehead and overlying the left orbit. CT CERVICAL SPINE FINDINGS Alignment: Straightening of the normal cervical lordosis. Skull base and vertebrae: No acute fracture. No primary bone lesion or focal pathologic process. Soft tissues and spinal canal: No prevertebral fluid or swelling. No visible canal hematoma. Disc levels: Focally mild cervical disc degenerative disease at C5-C6. Upper chest: Negative. Other: None. IMPRESSION: 1. No acute intracranial pathology. 2. Large, depressed blowout fracture of the left orbital floor fracture fragment measuring 1.7 x 1.7 cm, with 1.0 cm of maximum depression and fat herniation only. 3. Intraconal soft tissue stranding about the posterior left globe and left optic nerve. The contour of the left globe is intact. 4. Large soft tissue contusion and hematoma of the left forehead and overlying the left orbit. 5. No fracture or static subluxation of the cervical spine. Electronically Signed   By: Fredricka Jenny M.D.   On: 02/05/2024 12:54   CT Cervical Spine Wo Contrast Result Date: 02/05/2024 CLINICAL DATA:  Fall, head injury, hematoma to left eye EXAM: CT HEAD WITHOUT CONTRAST CT MAXILLOFACIAL WITHOUT CONTRAST CT CERVICAL SPINE WITHOUT CONTRAST TECHNIQUE: Multidetector CT imaging of the head, cervical spine, and maxillofacial structures were performed using the standard protocol without intravenous contrast. Multiplanar CT image reconstructions of the cervical spine and maxillofacial structures were also  generated. RADIATION DOSE REDUCTION: This exam was performed according to the departmental dose-optimization program which includes automated exposure control, adjustment of the mA and/or kV according to patient size and/or use of iterative reconstruction technique. COMPARISON:  None Available. FINDINGS: CT HEAD FINDINGS Brain: No evidence of acute infarction, hemorrhage, hydrocephalus, extra-axial collection or mass lesion/mass effect. Vascular: No hyperdense vessel or unexpected calcification. CT FACIAL BONES FINDINGS Skull: Normal. Negative for fracture or focal lesion. Facial bones: Large, depressed blowout fracture of the left orbital floor fracture fragment measuring 1.7 x 1.7 cm, with 1.0 cm of maximum depression and fat herniation only (series 8, image 28, series 9, image 39). Sinuses/Orbits: Intraconal soft tissue stranding about the posterior left globe and left optic nerve (series 3, image 7). The contour of the left globe is intact. Small hemorrhagic air-fluid level in the left maxillary sinus. Other: Large soft tissue contusion and hematoma of the left forehead and overlying the left orbit. CT CERVICAL SPINE FINDINGS Alignment: Straightening of the normal cervical lordosis. Skull base and vertebrae: No acute fracture. No primary bone lesion or focal pathologic process. Soft tissues and spinal canal: No prevertebral fluid or swelling. No visible canal hematoma. Disc levels: Focally mild cervical disc degenerative disease at C5-C6. Upper chest: Negative. Other: None. IMPRESSION: 1. No acute intracranial pathology. 2. Large, depressed blowout fracture of the left orbital floor  fracture fragment measuring 1.7 x 1.7 cm, with 1.0 cm of maximum depression and fat herniation only. 3. Intraconal soft tissue stranding about the posterior left globe and left optic nerve. The contour of the left globe is intact. 4. Large soft tissue contusion and hematoma of the left forehead and overlying the left orbit. 5. No  fracture or static subluxation of the cervical spine. Electronically Signed   By: Fredricka Jenny M.D.   On: 02/05/2024 12:54   PROCEDURES: Critical Care performed: No Procedures MEDICATIONS ORDERED IN ED: Medications  fluorescein 1 MG ophthalmic strip (1 strip  Given 02/05/24 1320)  tetracaine (PONTOCAINE) 0.5 % ophthalmic solution 1 drop (1 drop Left Eye Given 02/05/24 1328)  oxyCODONE-acetaminophen (PERCOCET/ROXICET) 5-325 MG per tablet 1 tablet (1 tablet Oral Given 02/05/24 1407)   IMPRESSION / MDM / ASSESSMENT AND PLAN / ED COURSE  I reviewed the triage vital signs and the nursing notes.                             The patient is on the cardiac monitor to evaluate for evidence of arrhythmia and/or significant heart rate changes. Patient's presentation is most consistent with acute presentation with potential threat to life or bodily function. Patient's 58 year old female with the above-stated past medical history who presents with a large hematoma and left eye injury after being pulled to the ground by her dogs.  At this time I have low suspicion for intracranial hemorrhage, cervical spinal fracture or dislocation, open globe, or other facial bone fracture.  Patient does have an orbital floor blowout fracture on the left with fat herniation however on exam patient does have diplopia with trying to look down which is concerning for possible extraocular muscle entrapment.  I spoke to the on-call ophthalmologist here in [place] who did not feel comfortable excepting this case.  I spoke to the on-call facial trauma physician at [facility] who is requesting patient to be seen in their clinic first thing tomorrow morning.  Patient feels comfortable with this plan and will be discharged with a prescription for Percocet as well as strict return precautions. The patient has been reexamined and is ready to be discharged.  All diagnostic results have been reviewed and discussed with the patient/family.  Care plan  has been outlined and the patient/family understands all current diagnoses, results, and treatment plans.  There are no new complaints, changes, or physical findings at this time.  All questions have been addressed and answered.  Patient was instructed to, and agrees to follow-up with their primary care physician as well as return to the emergency department if any new or worsening symptoms develop.   FINAL CLINICAL IMPRESSION(S) / ED DIAGNOSES   Final diagnoses:  Closed fracture of left orbit, initial encounter (HCC)  Contusion of face, initial encounter  Fall, initial encounter   Rx / DC Orders   ED Discharge Orders          Ordered    oxyCODONE-acetaminophen (PERCOCET) 5-325 MG tablet  Every 4 hours PRN        02/05/24 1408           Note:  This document was prepared using Dragon voice recognition software and may include unintentional dictation errors.   Arren Laminack K, MD 02/05/24 [phone]

## 2024-02-05 NOTE — ED Notes (Signed)
 [facility] transfer  center  called per  DR  Alejo Amsler  MD

## 2024-03-27 ENCOUNTER — Encounter: Payer: Self-pay | Admitting: *Deleted

## 2024-04-10 ENCOUNTER — Ambulatory Visit: Payer: Self-pay | Admitting: Anesthesiology

## 2024-04-10 ENCOUNTER — Encounter
Admission: RE | Disposition: A | Discharge status: Home / Self Care | Payer: Self-pay | Source: Home / Self Care | Attending: Gastroenterology

## 2024-04-10 ENCOUNTER — Ambulatory Visit
Admission: RE | Admit: 2024-04-10 | Discharge: 2024-04-10 | Disposition: A | Discharge status: Home / Self Care | Attending: Gastroenterology | Admitting: Gastroenterology

## 2024-04-10 DIAGNOSIS — I471 Supraventricular tachycardia, unspecified: Secondary | ICD-10-CM | POA: Insufficient documentation

## 2024-04-10 DIAGNOSIS — D123 Benign neoplasm of transverse colon: Secondary | ICD-10-CM | POA: Diagnosis not present

## 2024-04-10 DIAGNOSIS — Z9049 Acquired absence of other specified parts of digestive tract: Secondary | ICD-10-CM | POA: Insufficient documentation

## 2024-04-10 DIAGNOSIS — E66813 Obesity, class 3: Secondary | ICD-10-CM | POA: Diagnosis not present

## 2024-04-10 DIAGNOSIS — F32A Depression, unspecified: Secondary | ICD-10-CM | POA: Insufficient documentation

## 2024-04-10 DIAGNOSIS — K635 Polyp of colon: Secondary | ICD-10-CM | POA: Insufficient documentation

## 2024-04-10 DIAGNOSIS — Z6841 Body Mass Index (BMI) 40.0 and over, adult: Secondary | ICD-10-CM | POA: Diagnosis not present

## 2024-04-10 DIAGNOSIS — Z860101 Personal history of adenomatous and serrated colon polyps: Secondary | ICD-10-CM | POA: Diagnosis present

## 2024-04-10 DIAGNOSIS — Z1211 Encounter for screening for malignant neoplasm of colon: Secondary | ICD-10-CM | POA: Diagnosis not present

## 2024-04-10 DIAGNOSIS — K640 First degree hemorrhoids: Secondary | ICD-10-CM | POA: Insufficient documentation

## 2024-04-10 HISTORY — PX: POLYPECTOMY: SHX149

## 2024-04-10 HISTORY — PX: COLONOSCOPY: SHX5424

## 2024-04-10 SURGERY — COLONOSCOPY
Anesthesia: General

## 2024-04-10 MED ORDER — PROPOFOL 500 MG/50ML IV EMUL
INTRAVENOUS | Status: DC | PRN
  Administered 2024-04-10: 120 ug/kg/min via INTRAVENOUS
  Administered 2024-04-10: 80 mg via INTRAVENOUS

## 2024-04-10 MED ORDER — SODIUM CHLORIDE 0.9 % IV SOLN
INTRAVENOUS | Status: DC
  Administered 2024-04-10: 500 mL via INTRAVENOUS

## 2024-04-10 MED ORDER — LIDOCAINE HCL (CARDIAC) PF 100 MG/5ML IV SOSY
PREFILLED_SYRINGE | INTRAVENOUS | Status: DC | PRN
  Administered 2024-04-10: 100 mg via INTRAVENOUS

## 2024-04-10 NOTE — Op Note (Signed)
 [facility]astroenterology Patient Name: Brandy Rush Procedure Date: 04/10/2024 12:24 PM MRN: [id number] Account #: [id number] Date of Birth: 15-Feb-1966 Admit Type: Outpatient Age: 58 Room: [facility] ENDO ROOM 3 Gender: Female Note Status: Finalized Instrument Name: Colon Scope [id number] [id number] Procedure:             Colonoscopy Indications:           Surveillance: Personal history of adenomatous polyps                         on last colonoscopy 3 years ago Providers:             Ole Schick MD, MD Referring MD:          Oneil PHEBE Pinal, MD (Referring MD) Medicines:             Monitored Anesthesia Care Complications:         No immediate complications. Estimated blood loss:                         Minimal. Procedure:             Pre-Anesthesia Assessment:                        - Prior to the procedure, a History and Physical was                         performed, and patient medications and allergies were                         reviewed. The patient is competent. The risks and                         benefits of the procedure and the sedation options and                         risks were discussed with the patient. All questions                         were answered and informed consent was obtained.                         Patient identification and proposed procedure were                         verified by the physician, the nurse, the                         anesthesiologist, the anesthetist and the technician                         in the endoscopy suite. Mental Status Examination:                         alert and oriented. Airway Examination: normal                         oropharyngeal airway and neck mobility. Respiratory  Examination: clear to auscultation. CV Examination:                         normal. Prophylactic Antibiotics: The patient does not                         require prophylactic antibiotics. Prior                          Anticoagulants: The patient has taken no anticoagulant                         or antiplatelet agents. ASA Grade Assessment: III - A                         patient with severe systemic disease. After reviewing                         the risks and benefits, the patient was deemed in                         satisfactory condition to undergo the procedure. The                         anesthesia plan was to use monitored anesthesia care                         (MAC). Immediately prior to administration of                         medications, the patient was re-assessed for adequacy                         to receive sedatives. The heart rate, respiratory                         rate, oxygen saturations, blood pressure, adequacy of                         pulmonary ventilation, and response to care were                         monitored throughout the procedure. The physical                         status of the patient was re-assessed after the                         procedure.                        After obtaining informed consent, the colonoscope was                         passed under direct vision. Throughout the procedure,                         the patient's blood pressure, pulse, and oxygen  saturations were monitored continuously. The                         Colonoscope was introduced through the anus and                         advanced to the the cecum, identified by appendiceal                         orifice and ileocecal valve. The colonoscopy was                         performed without difficulty. The patient tolerated                         the procedure well. The quality of the bowel                         preparation was good. The ileocecal valve, appendiceal                         orifice, and rectum were photographed. Findings:      The perianal and digital rectal examinations were normal.      A 2 mm polyp was found in the cecum. The  polyp was sessile. The polyp       was removed with a cold snare. Resection and retrieval were complete.       Estimated blood loss was minimal.      A 3 mm polyp was found in the splenic flexure. The polyp was sessile.       The polyp was removed with a cold snare. Resection and retrieval were       complete. Estimated blood loss was minimal.      Internal hemorrhoids were found during retroflexion. The hemorrhoids       were Grade I (internal hemorrhoids that do not prolapse).      The exam was otherwise without abnormality on direct and retroflexion       views. Impression:            - One 2 mm polyp in the cecum, removed with a cold                         snare. Resected and retrieved.                        - One 3 mm polyp at the splenic flexure, removed with                         a cold snare. Resected and retrieved.                        - Internal hemorrhoids.                        - The examination was otherwise normal on direct and                         retroflexion views. Recommendation:        - Discharge patient to home.                        -  Resume previous diet.                        - Continue present medications.                        - Await pathology results.                        - Repeat colonoscopy in 5 years for surveillance.                        - Return to referring physician as previously                         scheduled. Procedure Code(s):     --- Professional ---                        [phone], Colonoscopy, flexible; with removal of                         tumor(s), polyp(s), or other lesion(s) by snare                         technique Diagnosis Code(s):     --- Professional ---                        Z86.010, Personal history of colonic polyps                        D12.0, Benign neoplasm of cecum                        D12.3, Benign neoplasm of transverse colon (hepatic                         flexure or splenic flexure)                         K64.0, First degree hemorrhoids CPT copyright 2022 [employer]. All rights reserved. The codes documented in this report are preliminary and upon coder review may  be revised to meet current compliance requirements. Ole Schick MD, MD 04/10/2024 12:49:48 PM Number of Addenda: 0 Note Initiated On: 04/10/2024 12:24 PM Scope Withdrawal Time: 0 hours 9 minutes 53 seconds  Total Procedure Duration: 0 hours 16 minutes 4 seconds  Estimated Blood Loss:  Estimated blood loss was minimal.      [facility]

## 2024-04-10 NOTE — Anesthesia Preprocedure Evaluation (Signed)
 Anesthesia Evaluation  Patient identified by MRN, date of birth, ID band Patient awake    Reviewed: Allergy & Precautions, NPO status , Patient's Chart, lab work & pertinent test results  Airway Mallampati: III  TM Distance: >3 FB Neck ROM: Full    Dental  (+) Dental Advidsory Given, Caps, Chipped   Pulmonary neg pulmonary ROS   Pulmonary exam normal breath sounds clear to auscultation- rhonchi (-) wheezing      Cardiovascular Exercise Tolerance: Good (-) hypertension(-) angina (-) Past MI and (-) Cardiac Stents + dysrhythmias Supra Ventricular Tachycardia (-) Valvular Problems/Murmurs Rhythm:Regular Rate:Normal - Systolic murmurs and - Diastolic murmurs    Neuro/Psych  Headaches, neg Seizures    GI/Hepatic negative GI ROS, Neg liver ROS,,,  Endo/Other  neg diabetes  Class 3 obesity  Renal/GU negative Renal ROS  negative genitourinary   Musculoskeletal   Abdominal   Peds negative pediatric ROS (+)  Hematology negative hematology ROS (+)   Anesthesia Other Findings Past Medical History: No date: Cyst of left ovary No date: Headache No date: Psoriasis   Reproductive/Obstetrics                              Anesthesia Physical Anesthesia Plan  ASA: 3  Anesthesia Plan: General   Post-op Pain Management:    Induction: Intravenous  PONV Risk Score and Plan: 3 and Propofol  infusion and TIVA  Airway Management Planned: Natural Airway and Nasal Cannula  Additional Equipment:   Intra-op Plan:   Post-operative Plan:   Informed Consent: I have reviewed the patients History and Physical, chart, labs and discussed the procedure including the risks, benefits and alternatives for the proposed anesthesia with the patient or authorized representative who has indicated his/her understanding and acceptance.     Dental advisory given  Plan Discussed with: Anesthesiologist and  CRNA  Anesthesia Plan Comments:          Anesthesia Quick Evaluation

## 2024-04-10 NOTE — H&P (Signed)
 Outpatient short stay form Pre-procedure 04/10/2024  Ole [name], MD  Primary Physician: Cleotilde Oneil FALCON, MD  Reason for visit:  Surveillance  History of present illness:    58 y/o lady with history of obesity and depression here for surveillance colonoscopy. Last colonoscopy in 2022 with small Ta's. History of cholecystectomy and two c-sections. No blood thinners. No family history of GI malignancies.     Current Facility-Administered Medications:    0.9 %  sodium chloride  infusion, , Intravenous, Continuous, Javaeh Muscatello, Ole ONEIDA, MD, Last Rate: 20 mL/hr at 04/10/24 1214, 500 mL at 04/10/24 1214  Medications Prior to Admission  Medication Sig Dispense Refill Last Dose/Taking   sertraline (ZOLOFT) 50 MG tablet Take 50 mg by mouth daily.   04/09/2024   benzonatate (TESSALON) 100 MG capsule Take 100 mg by mouth 3 (three) times daily as needed. (Patient not taking: Reported on 04/10/2024)   Completed Course   etodolac (LODINE) 400 MG tablet Take 400 mg by mouth 2 (two) times daily. (Patient not taking: Reported on 04/10/2024)   Completed Course   levonorgestrel (LILETTA, 52 MG,) 20.1 MCG/DAY IUD 1 each by Intrauterine route once.      metoprolol  tartrate (LOPRESSOR ) 25 MG tablet Take 1 tablet (25 mg total) by mouth 2 (two) times daily. 60 tablet 11    oxyCODONE -acetaminophen  (PERCOCET) 5-325 MG tablet Take 1 tablet by mouth every 4 (four) hours as needed for severe pain (pain score 7-10). (Patient not taking: Reported on 04/10/2024) 8 tablet 0 Completed Course   promethazine-dextromethorphan (PROMETHAZINE-DM) 6.25-15 MG/5ML syrup Take 5 mLs by mouth every 4 (four) hours as needed.        Allergies  Allergen Reactions   Codeine Sulfate [Codeine]    Penicillins Other (See Comments)    Childhood allergy (HIVES)     Past Medical History:  Diagnosis Date   Cyst of left ovary    Headache    Psoriasis     Review of systems:  Otherwise negative.    Physical Exam  Gen: Alert,  oriented. Appears stated age.  HEENT: PERRLA. Lungs: No respiratory distress CV: RRR Abd: soft, benign, no masses Ext: No edema    Planned procedures: Proceed with colonoscopy. The patient understands the nature of the planned procedure, indications, risks, alternatives and potential complications including but not limited to bleeding, infection, perforation, damage to internal organs and possible oversedation/side effects from anesthesia. The patient agrees and gives consent to proceed.  Please refer to procedure notes for findings, recommendations and patient disposition/instructions.     Ole [name], MD [facility]

## 2024-04-10 NOTE — Interval H&P Note (Signed)
 History and Physical Interval Note:  04/10/2024 12:16 PM  Brandy Rush  has presented today for surgery, with the diagnosis of History of adenomatous polyp of colon [Z86.0101].  The various methods of treatment have been discussed with the patient and family. After consideration of risks, benefits and other options for treatment, the patient has consented to  Procedure(s): COLONOSCOPY (N/A) as a surgical intervention.  The patient's history has been reviewed, patient examined, no change in status, stable for surgery.  I have reviewed the patient's chart and labs.  Questions were answered to the patient's satisfaction.     Ole [name]  Ok to proceed with colonoscopy

## 2024-04-10 NOTE — Transfer of Care (Signed)
 Immediate Anesthesia Transfer of Care Note  Patient: Brandy Rush  Procedure(s) Performed: COLONOSCOPY POLYPECTOMY, INTESTINE  Patient Location: PACU  Anesthesia Type:MAC  Level of Consciousness: awake and alert   Airway & Oxygen Therapy: Patient Spontanous Breathing  Post-op Assessment: Report given to RN and Post -op Vital signs reviewed and stable  Post vital signs: stable  Last Vitals:  Vitals Value Taken Time  BP 136/81 04/10/24 12:51  Temp    Pulse 107 04/10/24 12:52  Resp 18 04/10/24 12:52  SpO2 97 % 04/10/24 12:52  Vitals shown include unfiled device data.  Last Pain:  Vitals:   04/10/24 1251  TempSrc:   PainSc: 0-No pain         Complications: No notable events documented.

## 2024-04-12 NOTE — Anesthesia Postprocedure Evaluation (Signed)
 Anesthesia Post Note  Patient: Brandy Rush  Procedure(s) Performed: COLONOSCOPY POLYPECTOMY, INTESTINE  Patient location during evaluation: Endoscopy Anesthesia Type: General Level of consciousness: awake and alert Pain management: pain level controlled Vital Signs Assessment: post-procedure vital signs reviewed and stable Respiratory status: spontaneous breathing, nonlabored ventilation, respiratory function stable and patient connected to nasal cannula oxygen Cardiovascular status: blood pressure returned to baseline and stable Postop Assessment: no apparent nausea or vomiting Anesthetic complications: no   No notable events documented.   Last Vitals:  Vitals:   04/10/24 1251 04/10/24 1301  BP: 136/81 (!) 155/85  Pulse: (!) 107 (!) 102  Resp: 18 (!) 22  Temp:    SpO2: 97% 98%    Last Pain:  Vitals:   04/10/24 1301  TempSrc:   PainSc: 0-No pain                 Prentice Murphy

## 2024-04-13 LAB — SURGICAL PATHOLOGY

## 2024-05-18 ENCOUNTER — Ambulatory Visit

## 2024-05-18 DIAGNOSIS — L905 Scar conditions and fibrosis of skin: Secondary | ICD-10-CM

## 2024-05-18 DIAGNOSIS — L729 Follicular cyst of the skin and subcutaneous tissue, unspecified: Secondary | ICD-10-CM

## 2024-05-18 DIAGNOSIS — D229 Melanocytic nevi, unspecified: Secondary | ICD-10-CM

## 2024-05-18 DIAGNOSIS — W908XXA Exposure to other nonionizing radiation, initial encounter: Secondary | ICD-10-CM | POA: Diagnosis not present

## 2024-05-18 DIAGNOSIS — Z1283 Encounter for screening for malignant neoplasm of skin: Secondary | ICD-10-CM | POA: Diagnosis not present

## 2024-05-18 DIAGNOSIS — L578 Other skin changes due to chronic exposure to nonionizing radiation: Secondary | ICD-10-CM

## 2024-05-18 DIAGNOSIS — D1801 Hemangioma of skin and subcutaneous tissue: Secondary | ICD-10-CM

## 2024-05-18 DIAGNOSIS — L219 Seborrheic dermatitis, unspecified: Secondary | ICD-10-CM

## 2024-05-18 DIAGNOSIS — L814 Other melanin hyperpigmentation: Secondary | ICD-10-CM | POA: Diagnosis not present

## 2024-05-18 DIAGNOSIS — L570 Actinic keratosis: Secondary | ICD-10-CM

## 2024-05-18 DIAGNOSIS — L821 Other seborrheic keratosis: Secondary | ICD-10-CM | POA: Diagnosis not present

## 2024-05-18 DIAGNOSIS — L810 Postinflammatory hyperpigmentation: Secondary | ICD-10-CM

## 2024-05-18 MED ORDER — HYDROCORTISONE 2.5 % EX CREA: TOPICAL_CREAM | CUTANEOUS | 2 refills | Status: AC

## 2024-05-18 MED ORDER — KETOCONAZOLE 2 % EX CREA: TOPICAL_CREAM | CUTANEOUS | 5 refills | Status: AC

## 2024-05-18 NOTE — Patient Instructions (Signed)

## 2024-05-18 NOTE — Progress Notes (Signed)
 Subjective   Brandy Rush is a 58 y.o. female who presents for the following: Total body skin exam for skin cancer screening and mole check. The patient has spots, moles and lesions to be evaluated, some may be new or changing and the patient may have concern these could be cancer.. Patient is new patient  Today patient reports: Pt c/o lesion under the R eye that has been there many months, and a lesion on the L breast x 6 mths that started as a bump, pt squeezed out pus, but it still hasn't healed. Pt c/o discoloration around the L eye from a fall a year ago where she broke her eye shocket. No hx of skin cancer or abnormal nevi. Pt states hx of psoriasis, but well controlled and hasn't had to use topical medications, but she has had very dry skin behind and in the ears as well as itching.  Review of Systems:    No other skin or systemic complaints except as noted in HPI or Assessment and Plan.  The following portions of the chart were reviewed this encounter and updated as appropriate: medications, allergies, medical history  Relevant Medical History:  Family history of skin cancer - sister, unknown type, but not MM   Objective  Well appearing patient in no apparent distress; mood and affect are within normal limits. Examination was performed of the: Full Skin Examination: scalp, head, eyes, ears, nose, lips, neck, chest, axillae, abdomen, back, buttocks, bilateral upper extremities, bilateral lower extremities, hands, feet, fingers, toes, fingernails, and toenails.   Examination notable for: Lentigo/lentigines: Scattered pigmented macules that are tan to brown in color and are somewhat non-uniform in shape and concentrated in the sun-exposed areas, Nevus/nevi: Scattered well-demarcated, regular, pigmented macule(s) and/or papule(s)  , Seborrheic Keratosis(es): Stuck-on appearing keratotic papule(s) on the trunk, none  irritated with redness, crusting, edema, and/or partial avulsion,  Actinic Damage/Elastosis: chronic sun damage: dyspigmentation, telangiectasia, and wrinkling  - L lateral breast with 1.0 cm pink subcutaneous nodule  - Erythema and scaling of the scalp, ear canals Examination limited by: Undergarments and Patient deferred removal     Head - Anterior (Face) Erythematous thin papules/macules with gritty scale.   Assessment & Plan   SKIN CANCER SCREENING PERFORMED TODAY.  BENIGN SKIN FINDINGS  - Lentigines  - Seborrheic keratoses  - Hemangiomas   - Nevus/Multiple Benign Nevi - Reassurance provided regarding the benign appearance of lesions noted on exam today; no treatment is indicated in the absence of symptoms/changes. - Reinforced importance of photoprotective strategies including liberal and frequent sunscreen use of a broad-spectrum SPF 30 or greater, use of protective clothing, and sun avoidance for prevention of cutaneous malignancy and photoaging.  Counseled patient on the importance of regular self-skin monitoring as well as routine clinical skin examinations as scheduled.   ACTINIC DAMAGE - Chronic condition, secondary to cumulative UV/sun exposure - Recommend daily broad spectrum sunscreen SPF 30+ to sun-exposed areas, reapply every 2 hours as needed.  - Staying in the shade or wearing long sleeves, sun glasses (UVA+UVB protection) and wide brim hats (4-inch brim around the entire circumference of the hat) are also recommended for sun protection.  - Call for new or changing lesions.  Subcutaneous cyst, favor epidermal inclusion cyst of L lateral breast  - Explained to patient this most likely is consistent with an epidermal inclusion cyst, which represents trapped hair follicule and skin cells under the skin - Benign, patient reassured - Pt defers surgical excision at  this time. She will contact office if lesion flares for treatment - can consider ilk vs doxy vs excision   Scar with post inflammatory hyperpigmentation of the L eyebrow, due to  fall - Discussed chronicity of this condition and that these dark spots are from previous sites of inflammation.  - Stressed the importance of daily sunscreen use, SPF 30 or above daily. Recommended brands written down for patient. - recommend silicone scar gel   Seborrheic dermatitis Chronic and persistent condition with duration or expected duration over one year. Condition is symptomatic and bothersome to patient. Patient is flaring and not currently at treatment goal.  - Discussed diagnosis, typical course, and treatment options for this condition - Explained to the patient the chronic nature of this diagnosis - Start HC 2.5% cream to aa's outside of ear BID PRN when red and itchy.  - Start Ketoconazole  2% cream to aa's BID.   Procedures, orders, diagnosis for this visit:  AK (ACTINIC KERATOSIS) Head - Anterior (Face) Actinic keratoses are precancerous spots that appear secondary to cumulative UV radiation exposure/sun exposure over time. They are chronic with expected duration over 1 year. A portion of actinic keratoses will progress to squamous cell carcinoma of the skin. It is not possible to reliably predict which spots will progress to skin cancer and so treatment is recommended to prevent development of skin cancer.  Recommend daily broad spectrum sunscreen SPF 30+ to sun-exposed areas, reapply every 2 hours as needed.  Recommend staying in the shade or wearing long sleeves, sun glasses (UVA+UVB protection) and wide brim hats (4-inch brim around the entire circumference of the hat). Call for new or changing lesions.  Destruction of lesion - Head - Anterior (Face) Complexity: simple   Destruction method: cryotherapy   Informed consent: discussed and consent obtained   Timeout:  patient name, date of birth, surgical site, and procedure verified Lesion destroyed using liquid nitrogen: Yes   Region frozen until ice ball extended beyond lesion: Yes   Cryo cycles: 1 or 2. Outcome:  patient tolerated procedure well with no complications   Post-procedure details: wound care instructions given     AK (actinic keratosis) -     Destruction of lesion  Other orders -     Ketoconazole ; Apply to affected area of skin twice daily  Dispense: 30 g; Refill: 5 -     Hydrocortisone ; Apply 1 gram twice daily to affected areas of skin. Stop once resolved and restart as needed for flares. Avoid use on face, armpits, groin unless otherwise indicated.  Dispense: 60 g; Refill: 2    Return to clinic: Return in about 1 year (around 05/18/2025) for TBSE.  Documentation: I have reviewed the above documentation for accuracy and completeness, and I agree with the above.  Lauraine JAYSON Kanaris, MD

## 2025-05-19 ENCOUNTER — Ambulatory Visit
